# Patient Record
Sex: Male | Born: 1965 | Race: White | Hispanic: No
Health system: Southern US, Community
[De-identification: ages and names within clinical notes are randomized; demographics above are authoritative.]

---

## 2009-10-23 ENCOUNTER — Emergency Department: Payer: Self-pay | Admitting: Emergency Medicine

## 2011-04-11 ENCOUNTER — Emergency Department: Payer: Self-pay | Admitting: Emergency Medicine

## 2011-06-30 ENCOUNTER — Emergency Department: Payer: Self-pay | Admitting: Emergency Medicine

## 2011-07-23 ENCOUNTER — Emergency Department: Payer: Self-pay | Admitting: Emergency Medicine

## 2011-08-04 ENCOUNTER — Inpatient Hospital Stay: Payer: Self-pay | Admitting: Internal Medicine

## 2011-11-14 ENCOUNTER — Emergency Department: Payer: Self-pay | Admitting: Emergency Medicine

## 2011-11-14 LAB — DRUG SCREEN, URINE
Amphetamines, Ur Screen: NEGATIVE (ref ?–1000)
Barbiturates, Ur Screen: NEGATIVE (ref ?–200)
Benzodiazepine, Ur Scrn: NEGATIVE (ref ?–200)
Cannabinoid 50 Ng, Ur ~~LOC~~: NEGATIVE (ref ?–50)
MDMA (Ecstasy)Ur Screen: NEGATIVE (ref ?–500)
Opiate, Ur Screen: NEGATIVE (ref ?–300)
Phencyclidine (PCP) Ur S: NEGATIVE (ref ?–25)

## 2011-11-14 LAB — PROTIME-INR
INR: 0.9
Prothrombin Time: 12.7 secs (ref 11.5–14.7)

## 2011-11-14 LAB — URINALYSIS, COMPLETE
Bacteria: NONE SEEN
Bilirubin,UR: NEGATIVE
Glucose,UR: NEGATIVE mg/dL (ref 0–75)
Leukocyte Esterase: NEGATIVE
Nitrite: NEGATIVE
RBC,UR: <1 /HPF (ref 0–5)
Specific Gravity: 1.005 (ref 1.003–1.030)
Squamous Epithelial: NONE SEEN

## 2011-11-14 LAB — COMPREHENSIVE METABOLIC PANEL
Alkaline Phosphatase: 71 U/L (ref 50–136)
Anion Gap: 9 (ref 7–16)
BUN: 10 mg/dL (ref 7–18)
Bilirubin,Total: 0.7 mg/dL (ref 0.2–1.0)
Creatinine: 1.49 mg/dL — ABNORMAL HIGH (ref 0.60–1.30)
EGFR (African American): >60
EGFR (Non-African Amer.): 54 — ABNORMAL LOW
Glucose: 143 mg/dL — ABNORMAL HIGH (ref 65–99)
Potassium: 3.8 mmol/L (ref 3.5–5.1)
SGPT (ALT): 33 U/L
Sodium: 136 mmol/L (ref 136–145)
Total Protein: 8.5 g/dL — ABNORMAL HIGH (ref 6.4–8.2)

## 2011-11-14 LAB — CBC
HCT: 48 % (ref 40.0–52.0)
HGB: 16.2 g/dL (ref 13.0–18.0)
MCHC: 33.8 g/dL (ref 32.0–36.0)
MCV: 107 fL — ABNORMAL HIGH (ref 80–100)
Platelet: 176 10*3/uL (ref 150–440)
WBC: 17.4 10*3/uL — ABNORMAL HIGH (ref 3.8–10.6)

## 2011-11-14 LAB — ETHANOL: Ethanol: <3 mg/dL

## 2011-12-10 ENCOUNTER — Emergency Department: Payer: Self-pay | Admitting: Emergency Medicine

## 2011-12-10 LAB — COMPREHENSIVE METABOLIC PANEL
Albumin: 4 g/dL (ref 3.4–5.0)
Alkaline Phosphatase: 76 U/L (ref 50–136)
BUN: 4 mg/dL — ABNORMAL LOW (ref 7–18)
Calcium, Total: 8.9 mg/dL (ref 8.5–10.1)
Chloride: 104 mmol/L (ref 98–107)
Co2: 22 mmol/L (ref 21–32)
Creatinine: 0.94 mg/dL (ref 0.60–1.30)
Glucose: 186 mg/dL — ABNORMAL HIGH (ref 65–99)
Potassium: 3.1 mmol/L — ABNORMAL LOW (ref 3.5–5.1)
SGOT(AST): 33 U/L (ref 15–37)
SGPT (ALT): 22 U/L
Sodium: 138 mmol/L (ref 136–145)

## 2011-12-10 LAB — CBC
HCT: 48.1 % (ref 40.0–52.0)
HGB: 16.4 g/dL (ref 13.0–18.0)
MCH: 37.1 pg — ABNORMAL HIGH (ref 26.0–34.0)
MCV: 109 fL — ABNORMAL HIGH (ref 80–100)
Platelet: 245 10*3/uL (ref 150–440)

## 2011-12-10 LAB — LIPASE, BLOOD: Lipase: 4254 U/L — ABNORMAL HIGH (ref 73–393)

## 2011-12-10 LAB — URINALYSIS, COMPLETE
Bilirubin,UR: NEGATIVE
Blood: NEGATIVE
Glucose,UR: 150 mg/dL (ref 0–75)
Ketone: NEGATIVE
Leukocyte Esterase: NEGATIVE
Ph: 6 (ref 4.5–8.0)
Specific Gravity: 1.002 (ref 1.003–1.030)
WBC UR: NONE SEEN /HPF (ref 0–5)

## 2011-12-11 ENCOUNTER — Inpatient Hospital Stay: Payer: Self-pay | Admitting: Internal Medicine

## 2011-12-11 LAB — CBC
HCT: 45.9 % (ref 40.0–52.0)
HGB: 15.8 g/dL (ref 13.0–18.0)
MCH: 36.8 pg — ABNORMAL HIGH (ref 26.0–34.0)
MCV: 107 fL — ABNORMAL HIGH (ref 80–100)
Platelet: 263 10*3/uL (ref 150–440)
RBC: 4.3 10*6/uL — ABNORMAL LOW (ref 4.40–5.90)
WBC: 8.8 10*3/uL (ref 3.8–10.6)

## 2011-12-11 LAB — COMPREHENSIVE METABOLIC PANEL
Albumin: 4 g/dL (ref 3.4–5.0)
Alkaline Phosphatase: 83 U/L (ref 50–136)
Anion Gap: 10 (ref 7–16)
BUN: 4 mg/dL — ABNORMAL LOW (ref 7–18)
Creatinine: 0.79 mg/dL (ref 0.60–1.30)
EGFR (African American): >60
Glucose: 118 mg/dL — ABNORMAL HIGH (ref 65–99)
Osmolality: 277 (ref 275–301)
SGOT(AST): 41 U/L — ABNORMAL HIGH (ref 15–37)
SGPT (ALT): 27 U/L
Total Protein: 8.3 g/dL — ABNORMAL HIGH (ref 6.4–8.2)

## 2011-12-11 LAB — HEMOGLOBIN A1C: Hemoglobin A1C: 5.2 % (ref 4.2–6.3)

## 2011-12-11 LAB — PROTIME-INR: INR: 0.9

## 2011-12-12 LAB — HEPATIC FUNCTION PANEL A (ARMC)
Alkaline Phosphatase: 61 U/L (ref 50–136)
Bilirubin, Direct: <0.1 mg/dL (ref 0.00–0.20)
Bilirubin,Total: 0.6 mg/dL (ref 0.2–1.0)
SGOT(AST): 28 U/L (ref 15–37)
SGPT (ALT): 21 U/L
Total Protein: 6.4 g/dL (ref 6.4–8.2)

## 2011-12-13 ENCOUNTER — Emergency Department: Payer: Self-pay | Admitting: Emergency Medicine

## 2011-12-13 LAB — COMPREHENSIVE METABOLIC PANEL
Alkaline Phosphatase: 70 U/L (ref 50–136)
Anion Gap: 6 — ABNORMAL LOW (ref 7–16)
Creatinine: 0.82 mg/dL (ref 0.60–1.30)
EGFR (African American): >60
EGFR (Non-African Amer.): >60
Glucose: 95 mg/dL (ref 65–99)
Osmolality: 274 (ref 275–301)
Potassium: 3.8 mmol/L (ref 3.5–5.1)
SGPT (ALT): 24 U/L
Total Protein: 7.6 g/dL (ref 6.4–8.2)

## 2011-12-13 LAB — CBC
HCT: 39.3 % — ABNORMAL LOW (ref 40.0–52.0)
MCH: 37.9 pg — ABNORMAL HIGH (ref 26.0–34.0)
MCHC: 35.1 g/dL (ref 32.0–36.0)
Platelet: 193 10*3/uL (ref 150–440)
RDW: 12.7 % (ref 11.5–14.5)
WBC: 8.9 10*3/uL (ref 3.8–10.6)

## 2011-12-13 LAB — URINALYSIS, COMPLETE
Bacteria: NONE SEEN
Blood: NEGATIVE
Ketone: NEGATIVE
RBC,UR: NONE SEEN /HPF (ref 0–5)
Squamous Epithelial: <1
WBC UR: NONE SEEN /HPF (ref 0–5)

## 2012-02-17 ENCOUNTER — Inpatient Hospital Stay: Payer: Self-pay | Admitting: Specialist

## 2012-02-17 LAB — CBC
HCT: 41.9 % (ref 40.0–52.0)
HGB: 13.6 g/dL (ref 13.0–18.0)
MCH: 35 pg — ABNORMAL HIGH (ref 26.0–34.0)
MCHC: 32.4 g/dL (ref 32.0–36.0)
MCV: 108 fL — ABNORMAL HIGH (ref 80–100)
RBC: 3.87 10*6/uL — ABNORMAL LOW (ref 4.40–5.90)
RDW: 12.7 % (ref 11.5–14.5)

## 2012-02-17 LAB — URINALYSIS, COMPLETE
Bacteria: NONE SEEN
Bilirubin,UR: NEGATIVE
Glucose,UR: NEGATIVE mg/dL (ref 0–75)
Ketone: NEGATIVE
Leukocyte Esterase: NEGATIVE
Ph: 5 (ref 4.5–8.0)
Protein: NEGATIVE
RBC,UR: <1 /HPF (ref 0–5)
Squamous Epithelial: <1
WBC UR: 1 /HPF (ref 0–5)

## 2012-02-17 LAB — COMPREHENSIVE METABOLIC PANEL
Albumin: 3.4 g/dL (ref 3.4–5.0)
Alkaline Phosphatase: 81 U/L (ref 50–136)
Anion Gap: 7 (ref 7–16)
BUN: 8 mg/dL (ref 7–18)
Calcium, Total: 9.1 mg/dL (ref 8.5–10.1)
Creatinine: 0.91 mg/dL (ref 0.60–1.30)
Glucose: 103 mg/dL — ABNORMAL HIGH (ref 65–99)
SGOT(AST): 23 U/L (ref 15–37)
SGPT (ALT): 16 U/L
Total Protein: 7.3 g/dL (ref 6.4–8.2)

## 2012-02-17 LAB — LIPASE, BLOOD: Lipase: 3042 U/L — ABNORMAL HIGH (ref 73–393)

## 2012-02-18 LAB — COMPREHENSIVE METABOLIC PANEL WITH GFR
Albumin: 2.7 g/dL — ABNORMAL LOW
Alkaline Phosphatase: 68 U/L
Anion Gap: 9
BUN: 5 mg/dL — ABNORMAL LOW
Bilirubin,Total: 0.3 mg/dL
Calcium, Total: 8.2 mg/dL — ABNORMAL LOW
Chloride: 105 mmol/L
Co2: 26 mmol/L
Creatinine: 0.77 mg/dL
EGFR (African American): >60
EGFR (Non-African Amer.): >60
Glucose: 118 mg/dL — ABNORMAL HIGH
Osmolality: 278
Potassium: 3.7 mmol/L
SGOT(AST): 13 U/L — ABNORMAL LOW
SGPT (ALT): 12 U/L
Sodium: 140 mmol/L
Total Protein: 6.4 g/dL

## 2012-02-18 LAB — CBC WITH DIFFERENTIAL/PLATELET
Basophil #: 0 10*3/uL (ref 0.0–0.1)
Basophil %: 0.5 %
Eosinophil #: 0.5 10*3/uL (ref 0.0–0.7)
Eosinophil %: 6.9 %
HCT: 34.6 % — ABNORMAL LOW (ref 40.0–52.0)
Lymphocyte #: 1.6 10*3/uL (ref 1.0–3.6)
Lymphocyte %: 21.1 %
MCH: 36.2 pg — ABNORMAL HIGH (ref 26.0–34.0)
MCHC: 33.5 g/dL (ref 32.0–36.0)
MCV: 108 fL — ABNORMAL HIGH (ref 80–100)
Monocyte %: 8.7 %
Neutrophil %: 62.8 %
Platelet: 292 10*3/uL (ref 150–440)
RBC: 3.2 10*6/uL — ABNORMAL LOW (ref 4.40–5.90)
RDW: 12.9 % (ref 11.5–14.5)

## 2012-02-18 LAB — MAGNESIUM: Magnesium: 2.3 mg/dL

## 2012-02-19 LAB — BASIC METABOLIC PANEL
Calcium, Total: 8.5 mg/dL (ref 8.5–10.1)
Chloride: 108 mmol/L — ABNORMAL HIGH (ref 98–107)
Co2: 28 mmol/L (ref 21–32)
EGFR (Non-African Amer.): >60
Potassium: 3.7 mmol/L (ref 3.5–5.1)

## 2012-02-19 LAB — LIPASE, BLOOD: Lipase: 1321 U/L — ABNORMAL HIGH (ref 73–393)

## 2012-02-20 LAB — LIPASE, BLOOD: Lipase: 856 U/L — ABNORMAL HIGH (ref 73–393)

## 2012-02-21 LAB — COMPREHENSIVE METABOLIC PANEL
Anion Gap: 7 (ref 7–16)
Bilirubin,Total: 0.2 mg/dL (ref 0.2–1.0)
Chloride: 107 mmol/L (ref 98–107)
Co2: 29 mmol/L (ref 21–32)
Creatinine: 0.73 mg/dL (ref 0.60–1.30)
EGFR (African American): >60
EGFR (Non-African Amer.): >60
SGOT(AST): 12 U/L — ABNORMAL LOW (ref 15–37)
Sodium: 143 mmol/L (ref 136–145)
Total Protein: 6 g/dL — ABNORMAL LOW (ref 6.4–8.2)

## 2012-02-21 LAB — CBC WITH DIFFERENTIAL/PLATELET
Basophil #: 0.1 10*3/uL (ref 0.0–0.1)
Basophil %: 0.5 %
Eosinophil #: 0.7 10*3/uL (ref 0.0–0.7)
Eosinophil %: 6.6 %
HGB: 10.7 g/dL — ABNORMAL LOW (ref 13.0–18.0)
Lymphocyte %: 22.5 %
MCH: 36.2 pg — ABNORMAL HIGH (ref 26.0–34.0)
MCHC: 33.3 g/dL (ref 32.0–36.0)
Monocyte #: 0.8 x10 3/mm (ref 0.2–1.0)
Monocyte %: 8.2 %
Neutrophil #: 6.3 10*3/uL (ref 1.4–6.5)
Neutrophil %: 62.2 %
RDW: 12.9 % (ref 11.5–14.5)
WBC: 10 10*3/uL (ref 3.8–10.6)

## 2012-02-21 LAB — LIPASE, BLOOD: Lipase: >3000 U/L (ref 73–393)

## 2012-02-22 LAB — LIPASE, BLOOD: Lipase: 1369 U/L — ABNORMAL HIGH (ref 73–393)

## 2012-03-06 LAB — URINALYSIS, COMPLETE
Bacteria: NONE SEEN
Blood: NEGATIVE
Glucose,UR: NEGATIVE mg/dL (ref 0–75)
Leukocyte Esterase: NEGATIVE
Nitrite: NEGATIVE
Protein: NEGATIVE
RBC,UR: 2 /HPF (ref 0–5)
Specific Gravity: 1.019 (ref 1.003–1.030)
WBC UR: 1 /HPF (ref 0–5)

## 2012-03-06 LAB — CBC
HCT: 36.9 % — ABNORMAL LOW (ref 40.0–52.0)
HGB: 12.5 g/dL — ABNORMAL LOW (ref 13.0–18.0)
MCHC: 33.7 g/dL (ref 32.0–36.0)
Platelet: 418 10*3/uL (ref 150–440)
RBC: 3.51 10*6/uL — ABNORMAL LOW (ref 4.40–5.90)
WBC: 9.9 10*3/uL (ref 3.8–10.6)

## 2012-03-07 ENCOUNTER — Inpatient Hospital Stay: Payer: Self-pay | Admitting: Internal Medicine

## 2012-03-07 LAB — COMPREHENSIVE METABOLIC PANEL
Albumin: 3.5 g/dL (ref 3.4–5.0)
Anion Gap: 9 (ref 7–16)
Calcium, Total: 9.3 mg/dL (ref 8.5–10.1)
Co2: 27 mmol/L (ref 21–32)
EGFR (Non-African Amer.): >60
Glucose: 105 mg/dL — ABNORMAL HIGH (ref 65–99)
Osmolality: 280 (ref 275–301)
Potassium: 3.9 mmol/L (ref 3.5–5.1)
SGOT(AST): 18 U/L (ref 15–37)
SGPT (ALT): 14 U/L
Sodium: 141 mmol/L (ref 136–145)
Total Protein: 7.9 g/dL (ref 6.4–8.2)

## 2012-03-07 LAB — CBC WITH DIFFERENTIAL/PLATELET
Eosinophil #: 0.5 10*3/uL (ref 0.0–0.7)
Eosinophil %: 5.3 %
HCT: 36.3 % — ABNORMAL LOW (ref 40.0–52.0)
Lymphocyte #: 3 10*3/uL (ref 1.0–3.6)
MCH: 36 pg — ABNORMAL HIGH (ref 26.0–34.0)
MCV: 104 fL — ABNORMAL HIGH (ref 80–100)
Monocyte #: 0.8 x10 3/mm (ref 0.2–1.0)
Monocyte %: 8.9 %
Neutrophil #: 4.4 10*3/uL (ref 1.4–6.5)
Platelet: 386 10*3/uL (ref 150–440)
RBC: 3.48 10*6/uL — ABNORMAL LOW (ref 4.40–5.90)
WBC: 8.7 10*3/uL (ref 3.8–10.6)

## 2012-03-07 LAB — LIPASE, BLOOD
Lipase: >3000 U/L (ref 73–393)
Lipase: 3772 U/L — ABNORMAL HIGH (ref 73–393)

## 2012-03-08 LAB — CBC WITH DIFFERENTIAL/PLATELET
Basophil #: 0 10*3/uL (ref 0.0–0.1)
Basophil %: 0.5 %
Eosinophil #: 0.3 10*3/uL (ref 0.0–0.7)
HGB: 11.3 g/dL — ABNORMAL LOW (ref 13.0–18.0)
Lymphocyte #: 2.3 10*3/uL (ref 1.0–3.6)
Lymphocyte %: 31.3 %
MCH: 36 pg — ABNORMAL HIGH (ref 26.0–34.0)
MCHC: 34.3 g/dL (ref 32.0–36.0)
MCV: 105 fL — ABNORMAL HIGH (ref 80–100)
Monocyte #: 0.9 x10 3/mm (ref 0.2–1.0)
Neutrophil #: 3.9 10*3/uL (ref 1.4–6.5)
Platelet: 307 10*3/uL (ref 150–440)
RDW: 13.2 % (ref 11.5–14.5)

## 2012-03-08 LAB — BASIC METABOLIC PANEL
Anion Gap: 7 (ref 7–16)
Calcium, Total: 8.4 mg/dL — ABNORMAL LOW (ref 8.5–10.1)
Chloride: 111 mmol/L — ABNORMAL HIGH (ref 98–107)
Co2: 26 mmol/L (ref 21–32)
Glucose: 77 mg/dL (ref 65–99)
Osmolality: 282 (ref 275–301)
Potassium: 3.6 mmol/L (ref 3.5–5.1)
Sodium: 144 mmol/L (ref 136–145)

## 2012-03-08 LAB — LIPID PANEL
Cholesterol: 120 mg/dL (ref 0–200)
HDL Cholesterol: 23 mg/dL — ABNORMAL LOW (ref 40–60)
Ldl Cholesterol, Calc: 79 mg/dL (ref 0–100)
Triglycerides: 91 mg/dL (ref 0–200)
VLDL Cholesterol, Calc: 18 mg/dL (ref 5–40)

## 2012-03-16 ENCOUNTER — Emergency Department: Payer: Self-pay | Admitting: Emergency Medicine

## 2012-03-16 LAB — COMPREHENSIVE METABOLIC PANEL
Albumin: 4 g/dL (ref 3.4–5.0)
Alkaline Phosphatase: 91 U/L (ref 50–136)
Bilirubin,Total: 0.2 mg/dL (ref 0.2–1.0)
Chloride: 105 mmol/L (ref 98–107)
Co2: 28 mmol/L (ref 21–32)
Creatinine: 0.83 mg/dL (ref 0.60–1.30)
EGFR (African American): >60
EGFR (Non-African Amer.): >60
Osmolality: 280 (ref 275–301)
SGOT(AST): 26 U/L (ref 15–37)
Total Protein: 8.7 g/dL — ABNORMAL HIGH (ref 6.4–8.2)

## 2012-03-16 LAB — LIPASE, BLOOD: Lipase: >3000 U/L (ref 73–393)

## 2012-03-16 LAB — CBC
HCT: 44.4 % (ref 40.0–52.0)
MCHC: 33.6 g/dL (ref 32.0–36.0)
MCV: 103 fL — ABNORMAL HIGH (ref 80–100)
Platelet: 301 10*3/uL (ref 150–440)
RDW: 13.9 % (ref 11.5–14.5)

## 2012-03-16 LAB — ETHANOL: Ethanol: 225 mg/dL

## 2012-08-20 LAB — COMPREHENSIVE METABOLIC PANEL
Alkaline Phosphatase: 75 U/L (ref 50–136)
Anion Gap: 6 — ABNORMAL LOW (ref 7–16)
BUN: 8 mg/dL (ref 7–18)
Chloride: 106 mmol/L (ref 98–107)
Co2: 28 mmol/L (ref 21–32)
Creatinine: 0.73 mg/dL (ref 0.60–1.30)
EGFR (African American): >60
Glucose: 91 mg/dL (ref 65–99)
SGOT(AST): 18 U/L (ref 15–37)
SGPT (ALT): 15 U/L (ref 12–78)
Sodium: 140 mmol/L (ref 136–145)
Total Protein: 7.4 g/dL (ref 6.4–8.2)

## 2012-08-20 LAB — CBC
HCT: 38.3 % — ABNORMAL LOW (ref 40.0–52.0)
HGB: 13.5 g/dL (ref 13.0–18.0)
MCHC: 35.2 g/dL (ref 32.0–36.0)
MCV: 102 fL — ABNORMAL HIGH (ref 80–100)
RDW: 12.9 % (ref 11.5–14.5)
WBC: 12.4 10*3/uL — ABNORMAL HIGH (ref 3.8–10.6)

## 2012-08-20 LAB — TROPONIN I: Troponin-I: <0.02 ng/mL

## 2012-08-20 LAB — LIPASE, BLOOD: Lipase: >3000 U/L (ref 73–393)

## 2012-08-21 LAB — HEPATIC FUNCTION PANEL A (ARMC)
Alkaline Phosphatase: 70 U/L (ref 50–136)
Bilirubin, Direct: 0.1 mg/dL (ref 0.00–0.20)
Bilirubin,Total: 0.4 mg/dL (ref 0.2–1.0)
SGOT(AST): 13 U/L — ABNORMAL LOW (ref 15–37)
SGPT (ALT): 14 U/L (ref 12–78)
Total Protein: 6.8 g/dL (ref 6.4–8.2)

## 2012-08-21 LAB — CBC WITH DIFFERENTIAL/PLATELET
Basophil #: 0.1 10*3/uL (ref 0.0–0.1)
Basophil %: 0.5 %
Eosinophil %: 4.9 %
HGB: 12.4 g/dL — ABNORMAL LOW (ref 13.0–18.0)
Lymphocyte #: 2.3 10*3/uL (ref 1.0–3.6)
MCHC: 33.3 g/dL (ref 32.0–36.0)
MCV: 103 fL — ABNORMAL HIGH (ref 80–100)
Monocyte #: 0.9 x10 3/mm (ref 0.2–1.0)
Neutrophil #: 6.5 10*3/uL (ref 1.4–6.5)
Platelet: 166 10*3/uL (ref 150–440)
RDW: 12.9 % (ref 11.5–14.5)
WBC: 10.3 10*3/uL (ref 3.8–10.6)

## 2012-08-21 LAB — LIPID PANEL
Cholesterol: 189 mg/dL (ref 0–200)
HDL Cholesterol: 38 mg/dL — ABNORMAL LOW (ref 40–60)
Ldl Cholesterol, Calc: 105 mg/dL — ABNORMAL HIGH (ref 0–100)
Triglycerides: 232 mg/dL — ABNORMAL HIGH (ref 0–200)

## 2012-08-21 LAB — LIPASE, BLOOD: Lipase: >3000 U/L (ref 73–393)

## 2012-08-22 ENCOUNTER — Inpatient Hospital Stay: Payer: Self-pay | Admitting: Internal Medicine

## 2012-08-22 LAB — BASIC METABOLIC PANEL
Chloride: 110 mmol/L — ABNORMAL HIGH (ref 98–107)
Creatinine: 0.61 mg/dL (ref 0.60–1.30)
EGFR (Non-African Amer.): >60
Glucose: 93 mg/dL (ref 65–99)
Potassium: 3.8 mmol/L (ref 3.5–5.1)
Sodium: 143 mmol/L (ref 136–145)

## 2012-08-24 LAB — HEMOGLOBIN: HGB: 10.8 g/dL — ABNORMAL LOW (ref 13.0–18.0)

## 2013-05-21 IMAGING — CT CT ABDOMEN W/ CM
1 of 2 series · 15 of 32 positions shown, 19 images · IV contrast (isovue)
Comparison: none

REASON FOR EXAM: severe pancreatitis with elevated lipase
COMMENTS:

PROCEDURE:     CT  - CT ABDOMEN STANDARD W  - February 21, 2012 [DATE]
RESULT:     Comparison: CT of the abdomen and pelvis 02/17/2012 and 11/14/2011
TECHNIQUE: Multiple axial images of the abdomen were performed from the lung
bases to the iliac crests, with p.o. contrast and with 85 mL of Isovue 370
intravenous contrast.

[Series 2: 3mm soft tissue · axial · 0.67mm/px · z∈[-332,-56]mm · 15 of 102 slices shown, 19 images]
[im 5/102  soft-tissue]
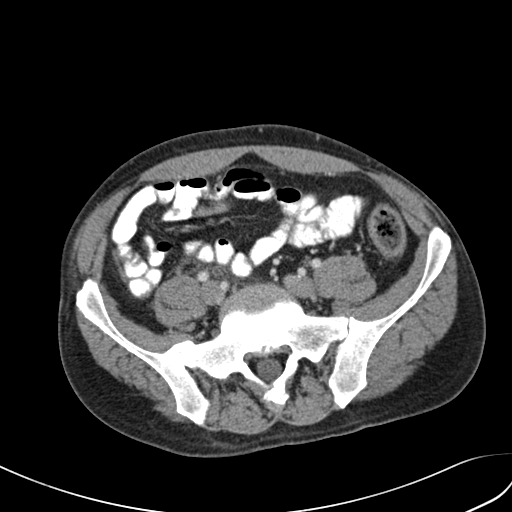
[im 5/102  bone]
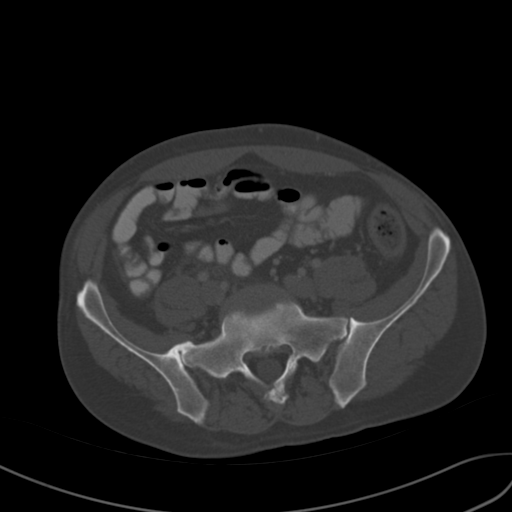
[im 14/102  soft-tissue]
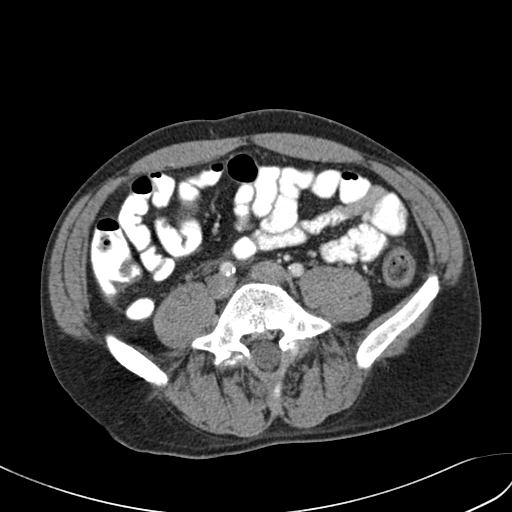
[im 22/102  soft-tissue]
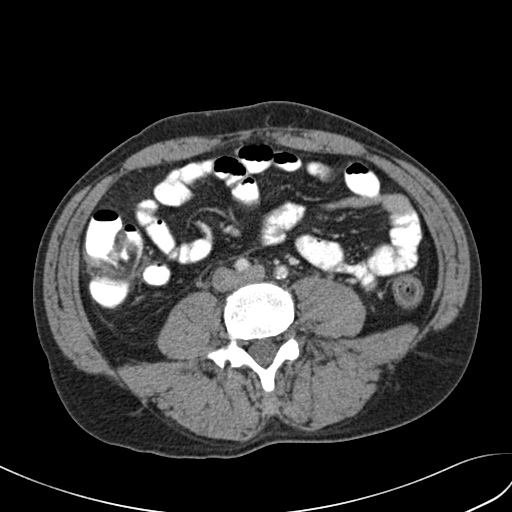
[im 27/102  soft-tissue]
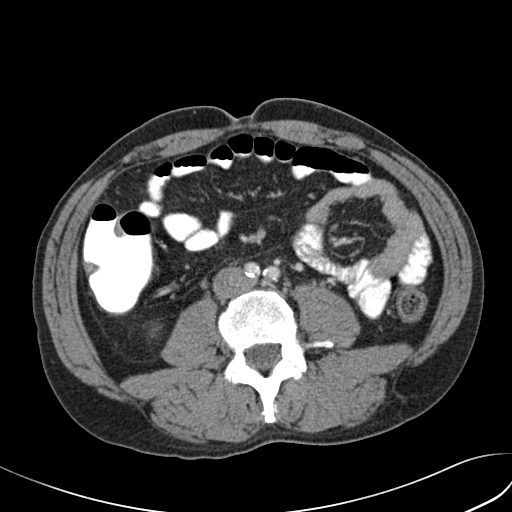
[im 36/102  soft-tissue]
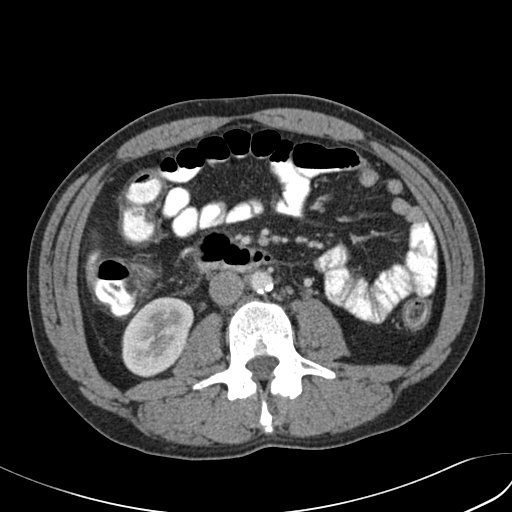
[im 44/102  soft-tissue]
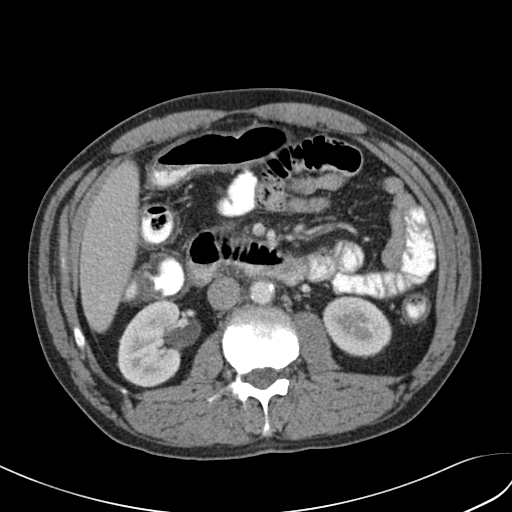
[im 53/102  soft-tissue]
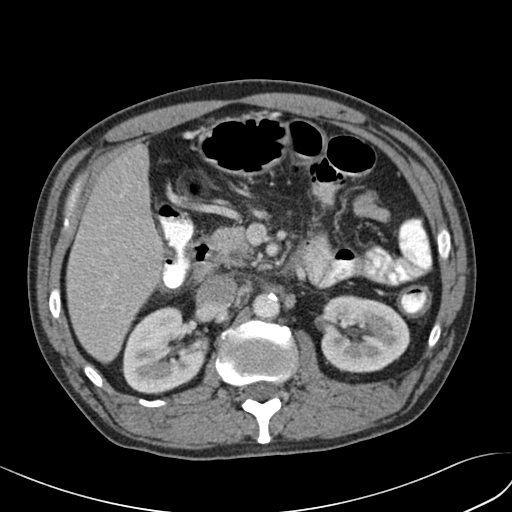
[im 58/102  soft-tissue]
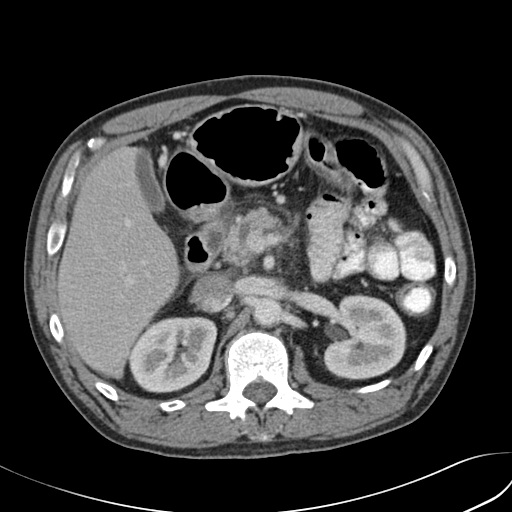
[im 66/102  soft-tissue]
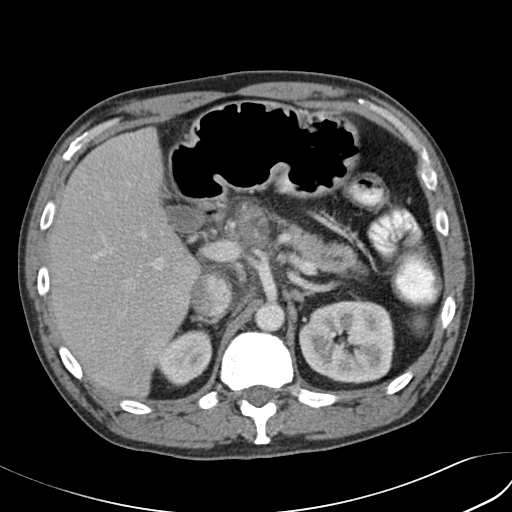
[im 66/102  bone]
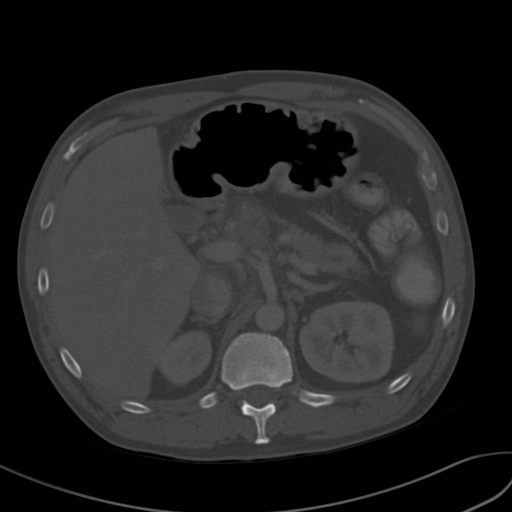
[im 75/102  soft-tissue]
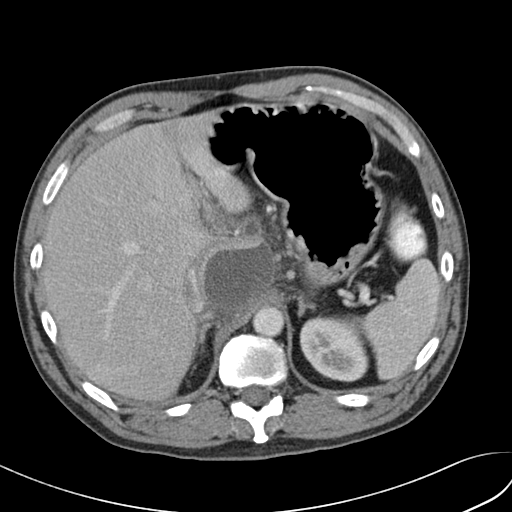
[im 80/102  soft-tissue]
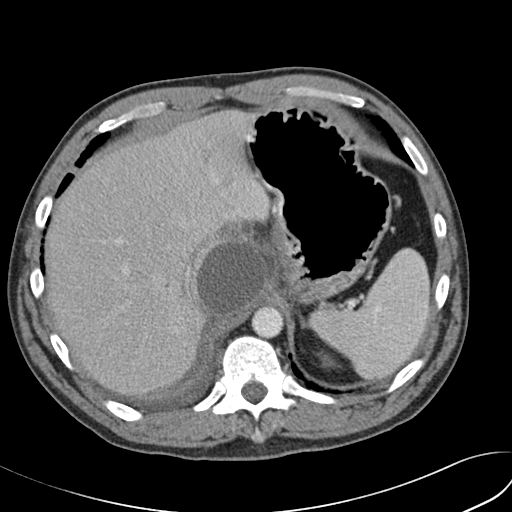
[im 84/102  lung]
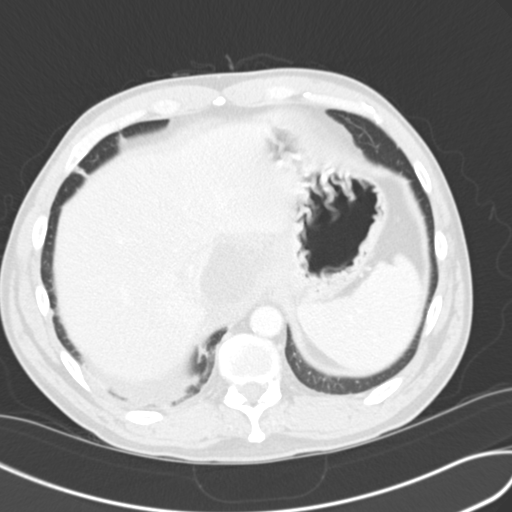
[im 88/102  soft-tissue]
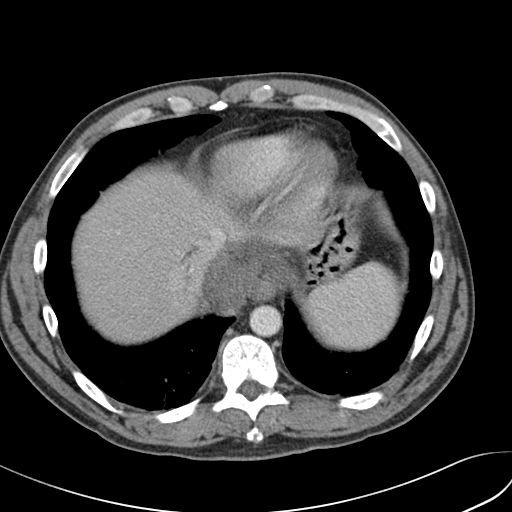
[im 88/102  lung]
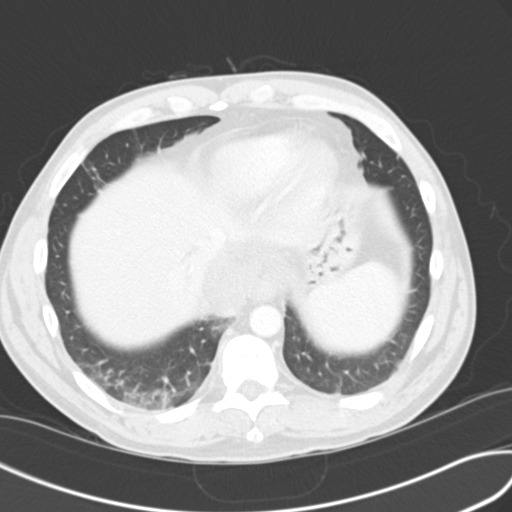
[im 93/102  lung]
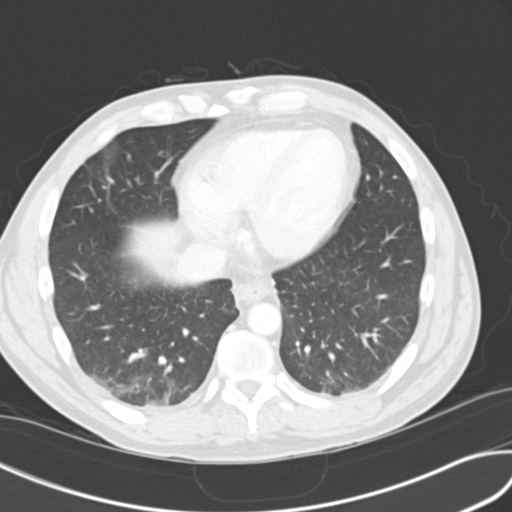
[im 97/102  soft-tissue]
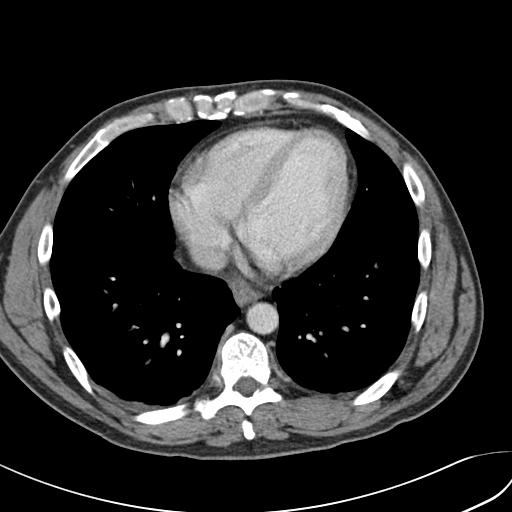
[im 97/102  lung]
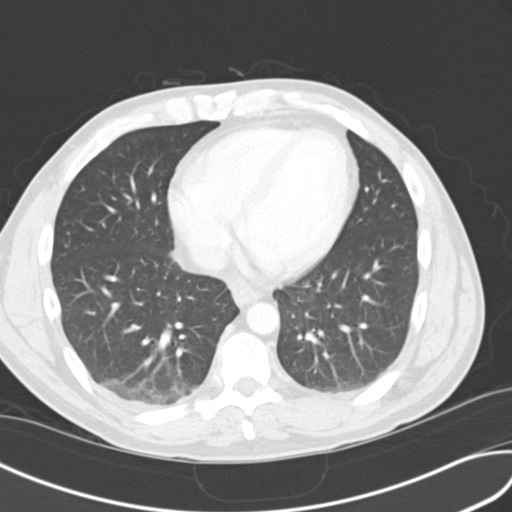

[15 of 32 positions shown; findings below may reference images not displayed]

FINDINGS: Mild basilar opacities are likely secondary to atelectasis. Minimal
low-attenuation along the falciform ligament likely represents focal fatty
deposition. The gallbladder, spleen, adrenals a are unremarkable. There is
mild inflammatory stranding surrounding the body and head of the pancreas
which is slightly decreased from prior. However, there are multiple small
fluid collections and low-attenuation lesions in the region the pancreatic
head and extending superiorly which likely represent small pseudocysts. Many
of these have increased in size from prior while others are new. The largest
is seen insinuating superiorly adjacent to the intrahepatic inferior vena
cava. It measures 4.6 x 3.9 cm. This causes some mass effect and flattening
of the intrahepatic inferior vena cava.

The kidneys enhance normally. The visualized small and large bowel are
normal in caliber.

No aggressive lytic or sclerotic osseous lesions are identified.
IMPRESSION: The inflammatory changes surrounding the pancreatic head and body have
decreased from prior. However, there are new and increased size of the small
fluid collections and low-attenuation lesions in the region of the
pancreatic head which extend superiorly. The largest is seen insinuating
superiorly adjacent to the intrahepatic IVC, causing mass effect on the IVC.
These most likely represent pseudocysts given the adjacent pancreatitis.
However, followup CT of the abdomen is recommended to ensure resolution and
exclude one or more of these representing a cystic neoplasm, though this is
felt unlikely.

## 2013-09-15 ENCOUNTER — Emergency Department: Payer: Self-pay | Admitting: Emergency Medicine

## 2013-09-19 ENCOUNTER — Emergency Department: Payer: Self-pay | Admitting: Emergency Medicine

## 2013-11-21 IMAGING — CT CT ABD-PELV W/ CM
1 of 2 series · 14 of 32 positions shown, 18 images · IV contrast (isovue)
Comparison: none

REASON FOR EXAM: (1) pancreatitis, h/o pancreatic mass, also pain in
lower abdomen; (2) same, h/o
COMMENTS:

PROCEDURE:     CT  - CT ABDOMEN / PELVIS  W  - August 23, 2012  [DATE]
RESULT:     Comparison:  02/17/2012, 02/21/2012
TECHNIQUE: Multiple axial images of the abdomen and pelvis were performed
from the lung bases to the pubic symphysis, with p.o. contrast and with 100
mL of Isovue 300 intravenous contrast.

[Series 2: 3mm soft tissue · axial · 0.67mm/px · z∈[-1132,-721]mm · 14 of 157 slices shown, 18 images]
[im 13/157  soft-tissue]
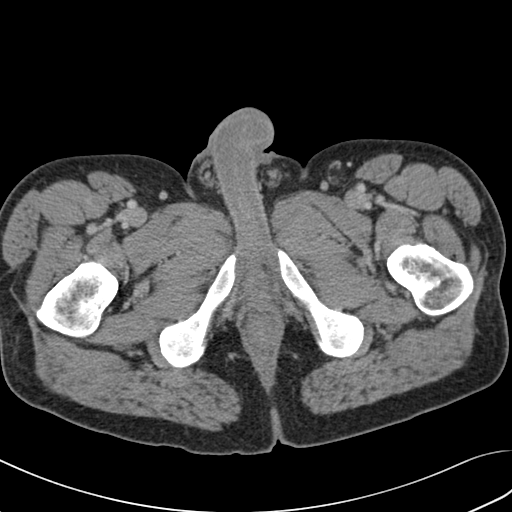
[im 13/157  bone]
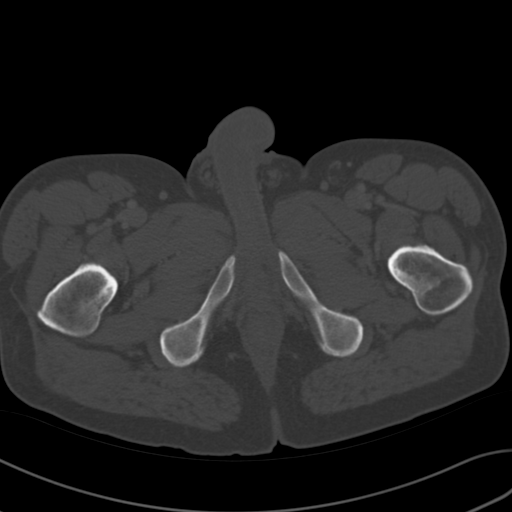
[im 25/157  soft-tissue]
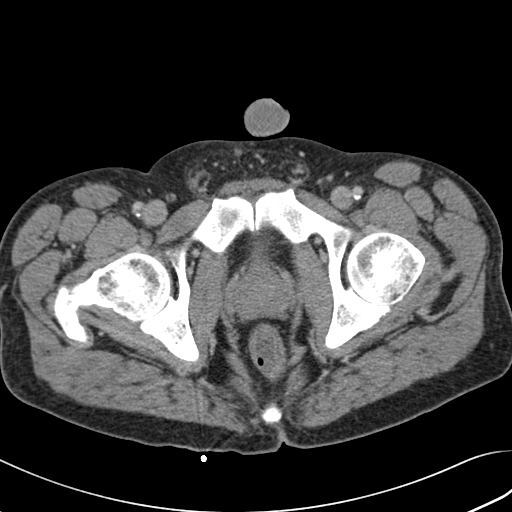
[im 38/157  soft-tissue]
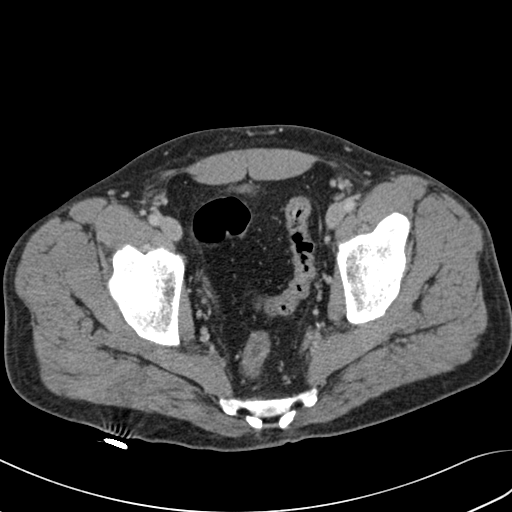
[im 50/157  soft-tissue]
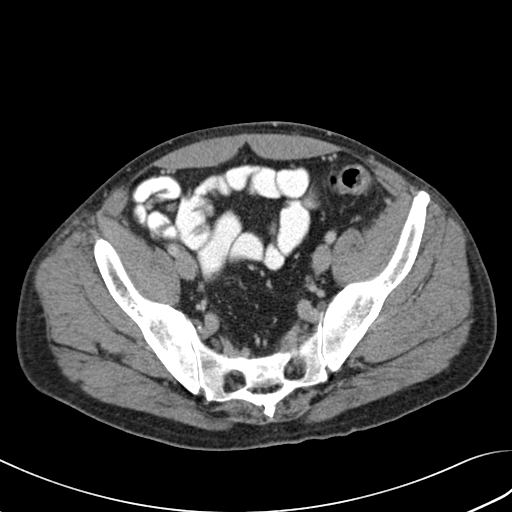
[im 63/157  soft-tissue]
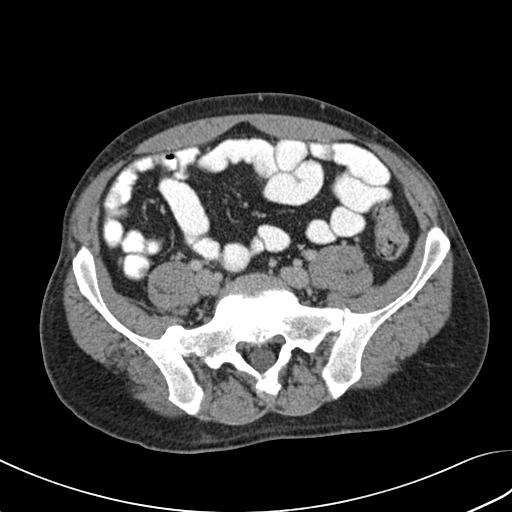
[im 75/157  soft-tissue]
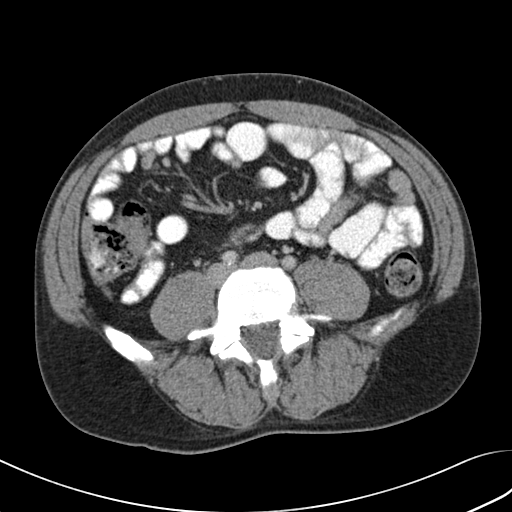
[im 88/157  soft-tissue]
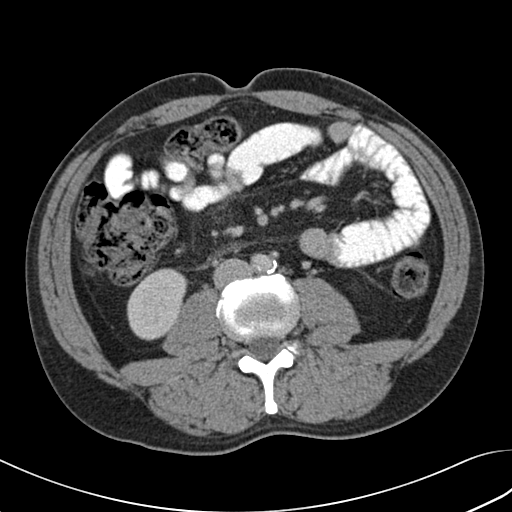
[im 100/157  soft-tissue]
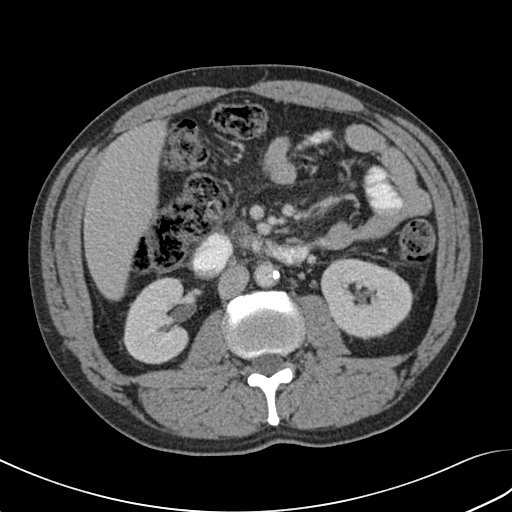
[im 113/157  soft-tissue]
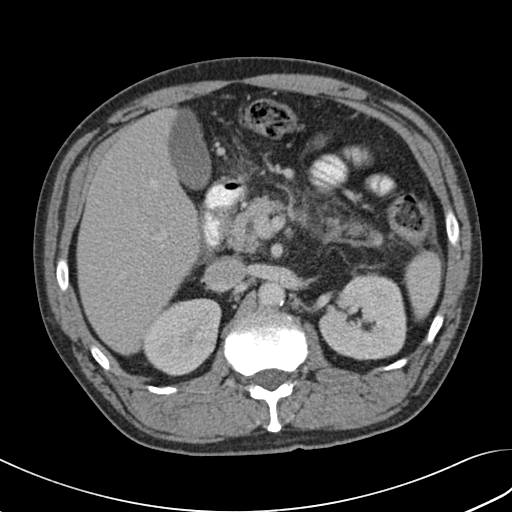
[im 113/157  bone]
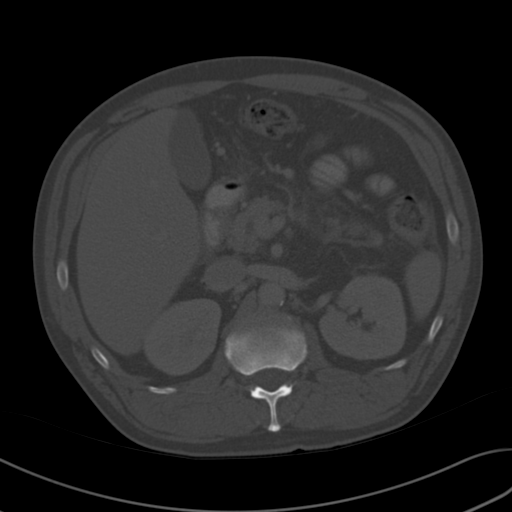
[im 125/157  soft-tissue]
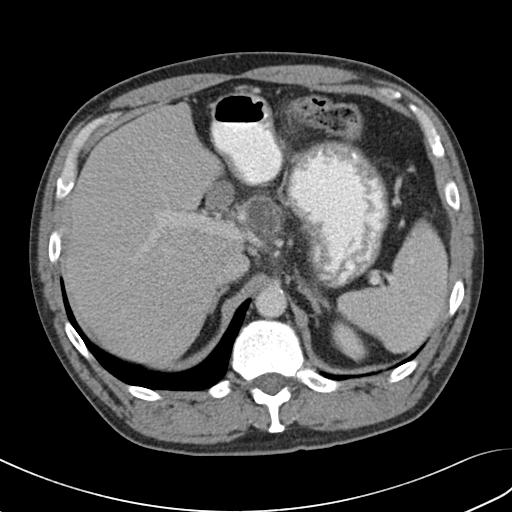
[im 132/157  lung]
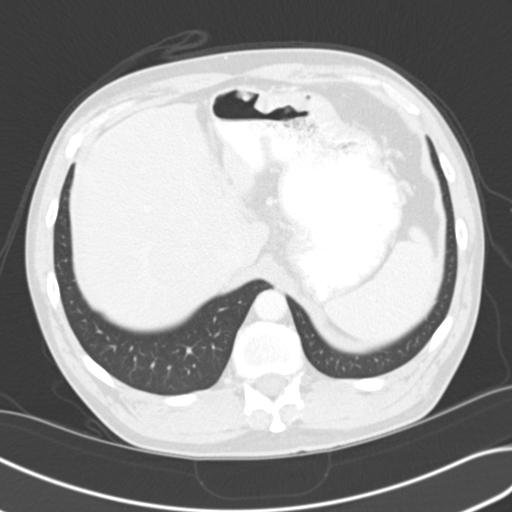
[im 138/157  soft-tissue]
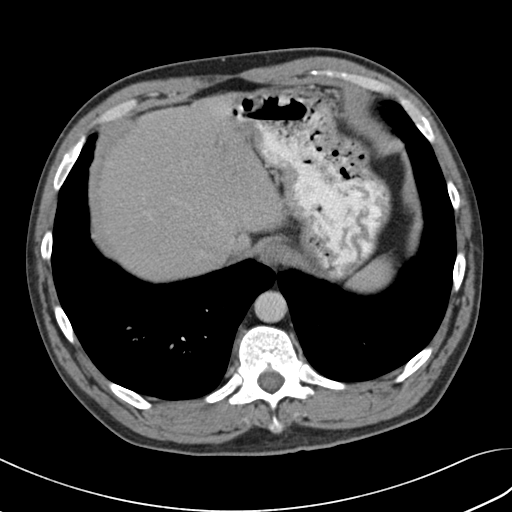
[im 138/157  lung]
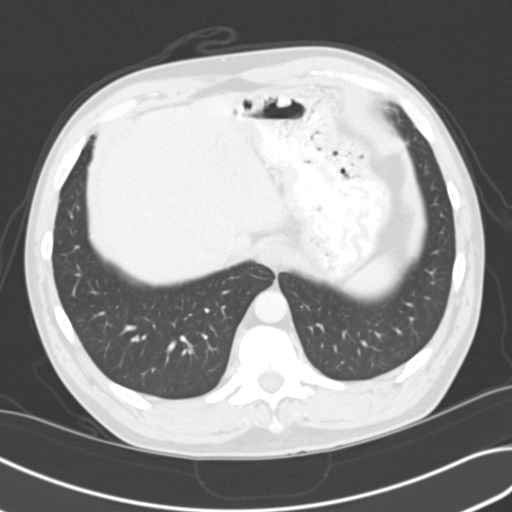
[im 144/157  lung]
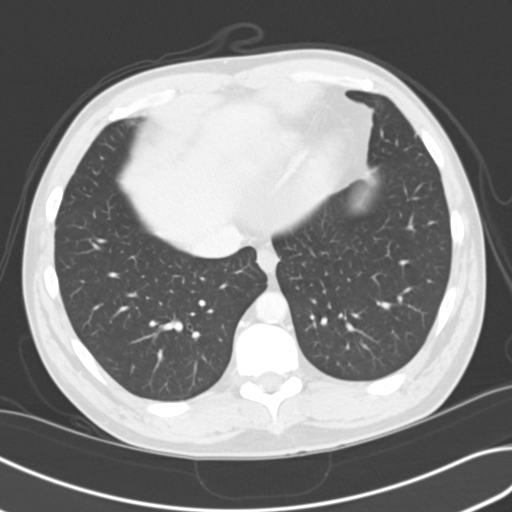
[im 150/157  soft-tissue]
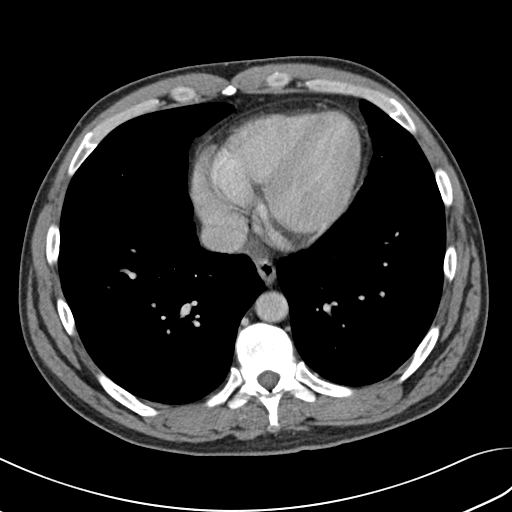
[im 150/157  lung]
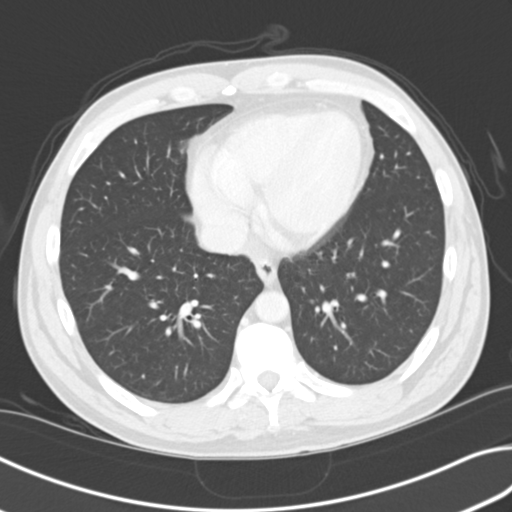

[14 of 32 positions shown; findings below may reference images not displayed]

FINDINGS: There are 3 subcentimeter pulmonary nodules which are immediately adjacent
to each other in the right lower lobe. The largest measures 8 mm. These have
decreased in size from 07/23/2011.

The liver, gallbladder, spleen, adrenals are unremarkable. There is a cystic
mass just superior to the head of the pancreas which is similar to prior. It
measures 2.8 x 2.0 cm in greatest axial dimension. This could represent a
cystic pancreatic neoplasm versus pseudocyst. As it appears to communicate
with the pancreatic duct, this raises the possibility of a cystic neoplasm
such as a sidebranch intraductal papillary mucinous neoplasm. The
inflammatory changes and cystic collection extending superiorly from this
region on the prior study has significantly decreased. There are mild
infiltrative changes surrounding the pancreas on today's study. The main
pancreatic duct is mildly dilated, similar to prior.

The kidneys enhance normally. The small and large bowel are normal in
caliber. Mild prominence of the sigmoid and rectal wall is likely secondary
to underdistention. The appendix is normal. Mild prominence of the bladder
wall is likely secondary to underdistention.

Small round sclerotic density in the right iliac wing is similar to prior.
IMPRESSION: 1. There are mild inflammatory changes surrounding the pancreas. Correlate
for acute pancreatitis. Overall, these changes are decreased from prior.
2. There is a small cystic mass just superior to the pancreas which is
similar in size to prior. Differential would include a pseudocyst as well a
cystic pancreatic neoplasm. It appears to communicate with the main
pancreatic duct, which would raise the possibility of a sidebranch
intraductal papillary mucinous neoplasm. ERCP and EUS is suggested, if not
already performed.
3. Mild prominence of the sigmoid and rectal wall is likely secondary to
underdistention. However, correlate for colitis.

## 2013-11-22 IMAGING — CR DG SHOULDER 3+V*L*
1 series · 3 of 3 positions shown · non-contrast
Comparison: none

REASON FOR EXAM: fall, pain
COMMENTS:

PROCEDURE:     DXR - DXR SHOULDER LEFT COMPLETE  - August 24, 2012  [DATE]
RESULT:     Comparison: None.

[Series 1: w shoulder internal left · 0.14mm/px · 3 of 3 slices shown]
[im 1/3]
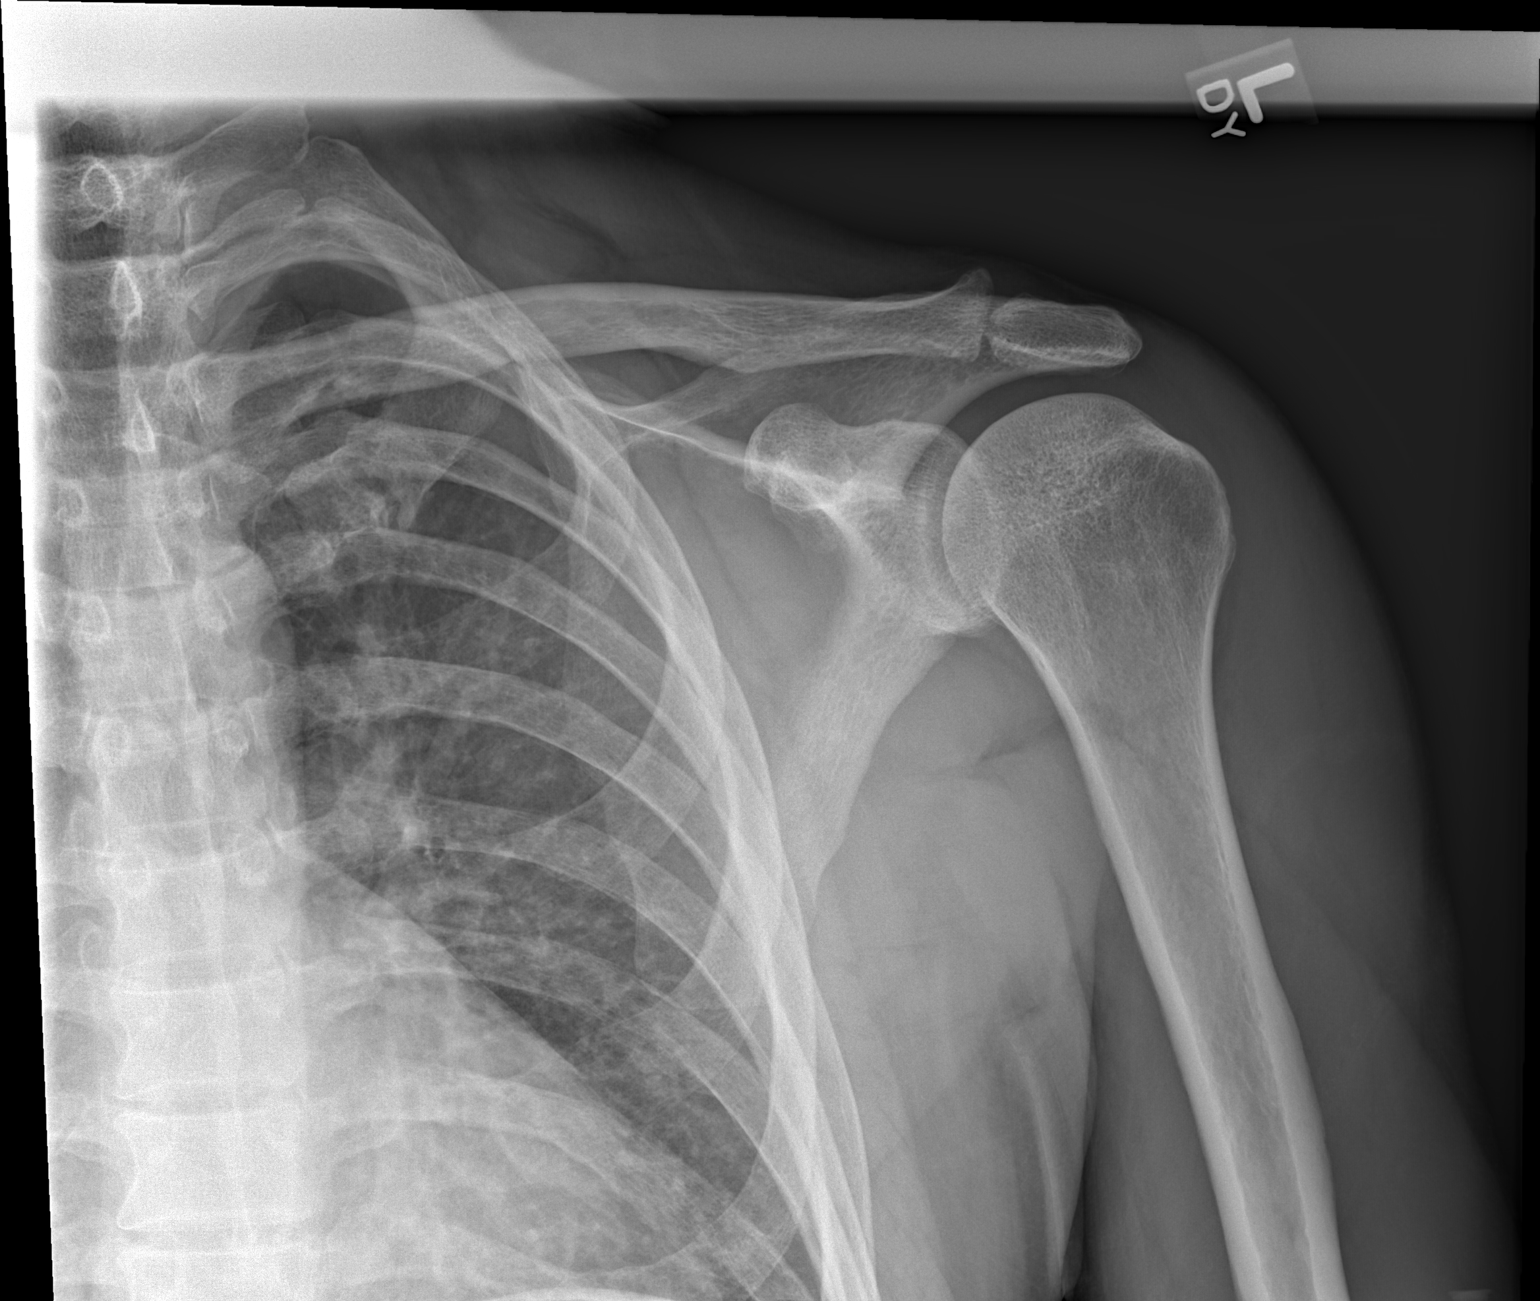
[im 2/3]
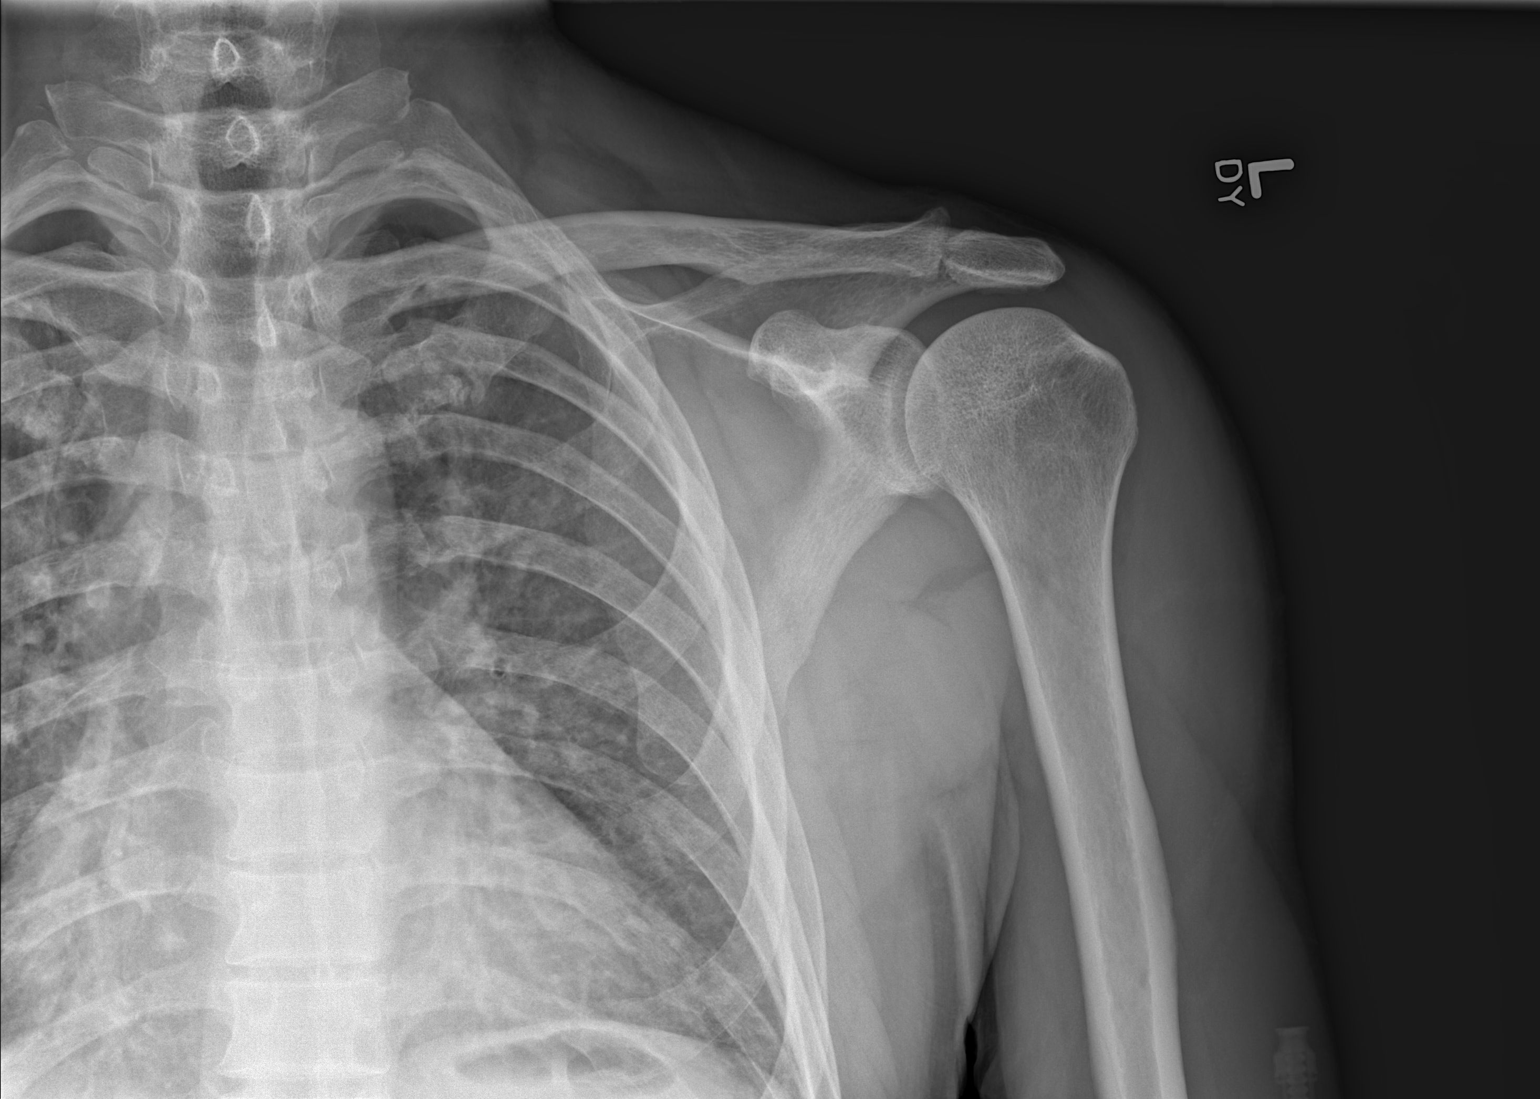
[im 3/3]
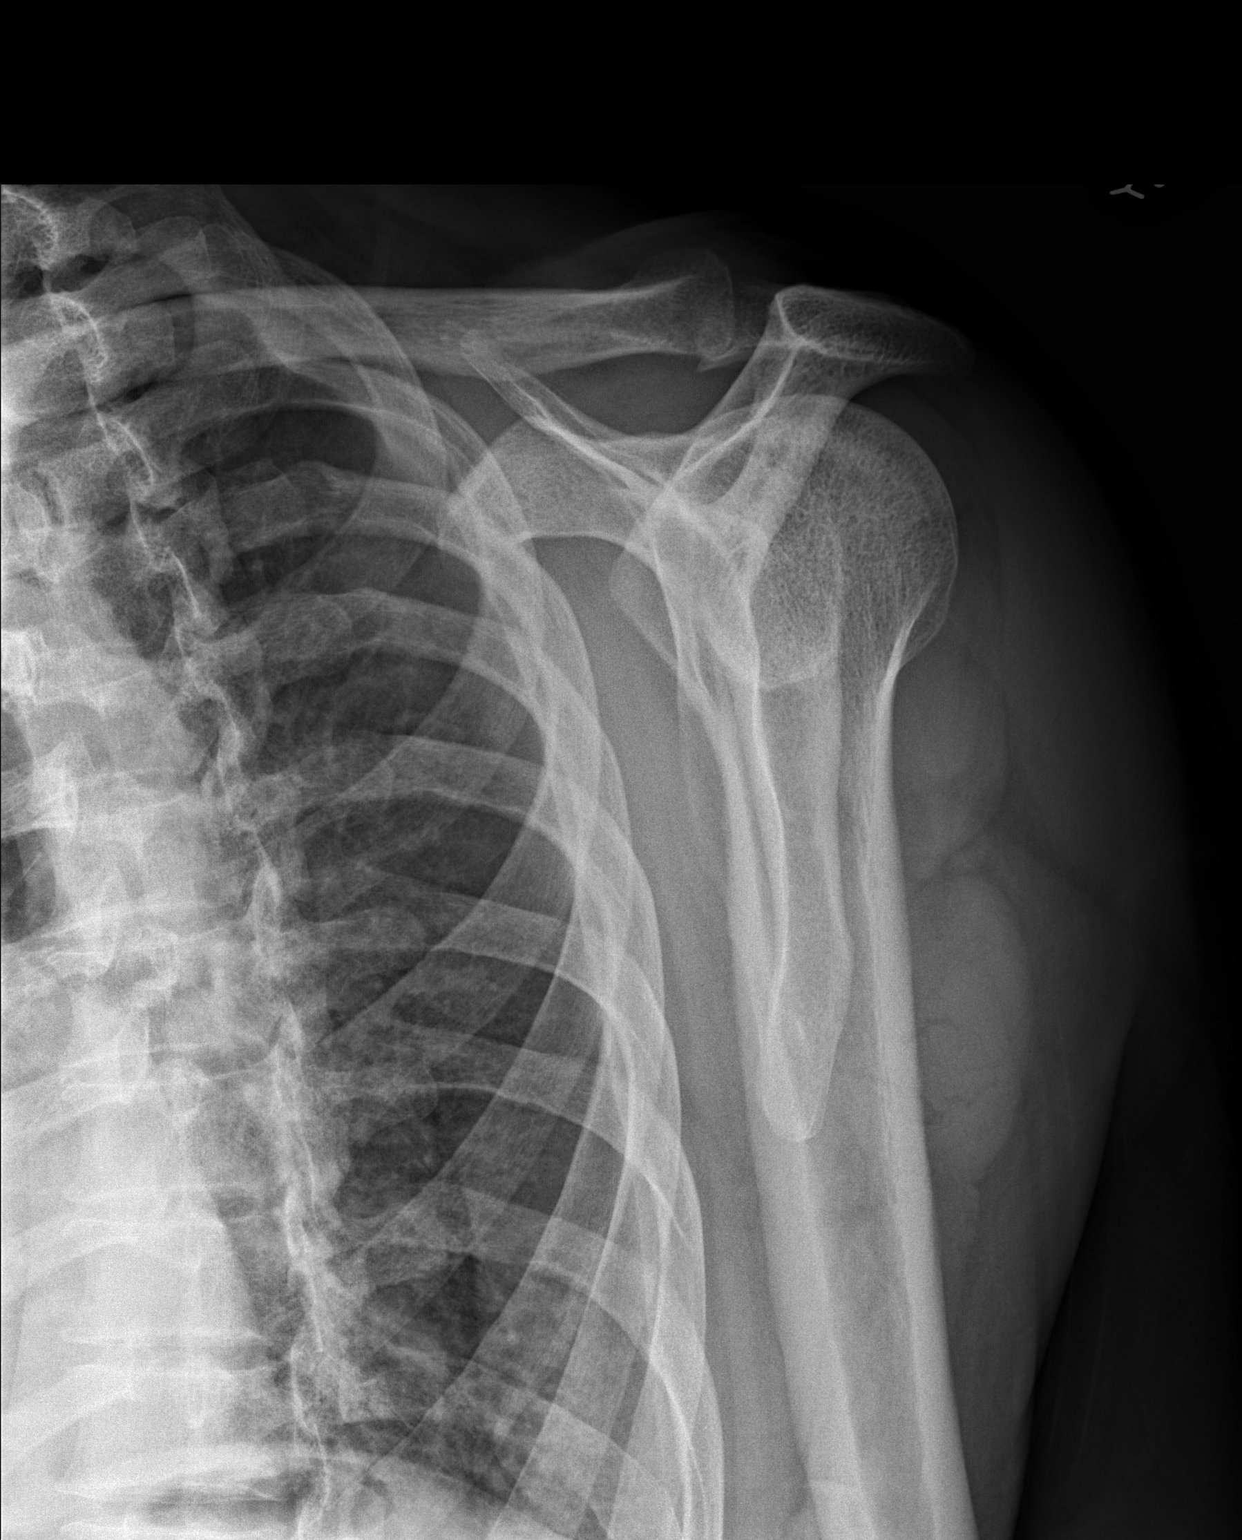

[3 of 3 positions shown; findings below may reference images not displayed]

FINDINGS: There is minimal degenerative change of the acromioclavicular joint. No
acute fracture or dislocation.
IMPRESSION: No acute fracture or dislocation.

[REDACTED]

## 2013-12-29 ENCOUNTER — Emergency Department: Payer: Self-pay | Admitting: Emergency Medicine

## 2013-12-30 LAB — URINALYSIS, COMPLETE
Bacteria: NONE SEEN
Bilirubin,UR: NEGATIVE
Blood: NEGATIVE
Glucose,UR: NEGATIVE mg/dL (ref 0–75)
Ketone: NEGATIVE
LEUKOCYTE ESTERASE: NEGATIVE
Nitrite: NEGATIVE
PROTEIN: NEGATIVE
Ph: 5 (ref 4.5–8.0)
RBC,UR: NONE SEEN /HPF (ref 0–5)
Specific Gravity: 1.011 (ref 1.003–1.030)
Squamous Epithelial: <1
WBC UR: <1 /HPF (ref 0–5)

## 2013-12-30 LAB — DRUG SCREEN, URINE
Amphetamines, Ur Screen: NEGATIVE (ref ?–1000)
BARBITURATES, UR SCREEN: NEGATIVE (ref ?–200)
Benzodiazepine, Ur Scrn: POSITIVE (ref ?–200)
Cannabinoid 50 Ng, Ur ~~LOC~~: POSITIVE (ref ?–50)
Cocaine Metabolite,Ur ~~LOC~~: POSITIVE (ref ?–300)
MDMA (Ecstasy)Ur Screen: NEGATIVE (ref ?–500)
Methadone, Ur Screen: NEGATIVE (ref ?–300)
OPIATE, UR SCREEN: NEGATIVE (ref ?–300)
Phencyclidine (PCP) Ur S: NEGATIVE (ref ?–25)
Tricyclic, Ur Screen: NEGATIVE (ref ?–1000)

## 2013-12-30 LAB — CBC
HCT: 44.2 % (ref 40.0–52.0)
HGB: 15.2 g/dL (ref 13.0–18.0)
MCH: 37.6 pg — ABNORMAL HIGH (ref 26.0–34.0)
MCHC: 34.4 g/dL (ref 32.0–36.0)
MCV: 109 fL — ABNORMAL HIGH (ref 80–100)
PLATELETS: 170 10*3/uL (ref 150–440)
RBC: 4.05 10*6/uL — ABNORMAL LOW (ref 4.40–5.90)
RDW: 12.2 % (ref 11.5–14.5)
WBC: 11.5 10*3/uL — ABNORMAL HIGH (ref 3.8–10.6)

## 2013-12-30 LAB — COMPREHENSIVE METABOLIC PANEL
ALBUMIN: 3.9 g/dL (ref 3.4–5.0)
ANION GAP: 8 (ref 7–16)
Alkaline Phosphatase: 63 U/L
BUN: 5 mg/dL — ABNORMAL LOW (ref 7–18)
Bilirubin,Total: 0.1 mg/dL — ABNORMAL LOW (ref 0.2–1.0)
CO2: 24 mmol/L (ref 21–32)
Calcium, Total: 8.5 mg/dL (ref 8.5–10.1)
Chloride: 107 mmol/L (ref 98–107)
Creatinine: 0.98 mg/dL (ref 0.60–1.30)
EGFR (African American): >60
EGFR (Non-African Amer.): >60
Glucose: 106 mg/dL — ABNORMAL HIGH (ref 65–99)
Osmolality: 275 (ref 275–301)
Potassium: 3.8 mmol/L (ref 3.5–5.1)
SGOT(AST): 57 U/L — ABNORMAL HIGH (ref 15–37)
SGPT (ALT): 51 U/L (ref 12–78)
SODIUM: 139 mmol/L (ref 136–145)
Total Protein: 7.8 g/dL (ref 6.4–8.2)

## 2013-12-30 LAB — LIPASE, BLOOD: Lipase: 448 U/L — ABNORMAL HIGH (ref 73–393)

## 2013-12-30 LAB — ETHANOL
ETHANOL LVL: 224 mg/dL
ETHANOL LVL: 64 mg/dL
Ethanol %: 0.224 % — ABNORMAL HIGH (ref 0.000–0.080)
Ethanol %: 0.064 % (ref 0.000–0.080)

## 2013-12-30 LAB — ACETAMINOPHEN LEVEL

## 2013-12-30 LAB — SALICYLATE LEVEL: Salicylates, Serum: 3.9 mg/dL — ABNORMAL HIGH

## 2014-11-09 ENCOUNTER — Emergency Department
Admit: 2014-11-09 | Disposition: A | Discharge status: Home / Self Care | Payer: Self-pay | Admitting: Emergency Medicine

## 2014-11-24 NOTE — Consult Note (Signed)
Chief Complaint:   Subjective/Chief Complaint Feels better but continues to complain of abdominal pain. No vomiting.   VITAL SIGNS/ANCILLARY NOTES: **Vital Signs.:   18-Jul-13 18:03   Vital Signs Type Q 4hr   Temperature Temperature (F) 98.7   Celsius 37   Temperature Source Oral   Pulse Pulse 86   Respirations Respirations 20   Systolic BP Systolic BP 168   Diastolic BP (mmHg) Diastolic BP (mmHg) 79   Mean BP 108   Pulse Ox % Pulse Ox % 98   Oxygen Delivery Room Air/ 21 %   Lab Results: Routine Chem:  18-Jul-13 04:20    Lipase  1369 (Result(s) reported on 22 Feb 2012 at 04:56AM.)   Assessment/Plan:  Assessment/Plan:   Assessment Pancreatitis, clinically improving. Psudocysts.    Plan Will keep him NPO except ice chips today and try clear liquids in am. Probable DC in 48-72 hours on liquid diet. Dr. Bluford Kaufmannh will see him tomorrow and Dr. Ricki RodriguezSkulski wil follow over the weekend.   Electronic Signatures: Lurline DelIftikhar, Olden Klauer (MD)  (Signed [phone]-Jul-13 21:13)  Authored: Chief Complaint, VITAL SIGNS/ANCILLARY NOTES, Lab Results, Assessment/Plan   Last Updated: 18-Jul-13 21:13 by Lurline DelIftikhar, Janisa Labus (MD)

## 2014-11-24 NOTE — Consult Note (Signed)
Pt seen and examined. Please see Thomas Holland's notes. Recurrent pancreatitis. Abnormal CT in the past. [facility]. Lipase still high. NPO except for meds rest of today. If lipase improves, can start with clears 1st and panc enzyme supplements. Will need EUS arranged by primary MD once patient ready for discharge. Will follow. Thanks.  Electronic Signatures: Lutricia Feilh, Rajesh Wyss (MD)  (Signed on 01-Aug-13 15:40)  Authored  Last Updated: 01-Aug-13 15:40 by Lutricia Feilh, Khylie Larmore (MD)

## 2014-11-24 NOTE — Consult Note (Signed)
Chief Complaint:   Subjective/Chief Complaint Passing gas. Pain somewhat better.   VITAL SIGNS/ANCILLARY NOTES: **Vital Signs.:   15-Jul-13 17:53   Vital Signs Type Q 4hr   Temperature Temperature (F) 98.1   Celsius 36.7   Temperature Source Oral   Pulse Pulse 104   Respirations Respirations 18   Systolic BP Systolic BP 358   Diastolic BP (mmHg) Diastolic BP (mmHg) 95   Mean BP 119   Pulse Ox % Pulse Ox % 97   Brief Assessment:   Additional Physical Exam Positive bowel sounds. Mild epigastric guarding and tenderness.   Lab Results: Routine Chem:  15-Jul-13 05:13    Glucose, Serum 88   BUN  4   Creatinine (comp) 0.75   Sodium, Serum 143   Potassium, Serum 3.7   Chloride, Serum  108   CO2, Serum 28   Calcium (Total), Serum 8.5   Anion Gap 7   Osmolality (calc) 281   eGFR (African American) >60   eGFR (Non-African American) >60 (eGFR values <48m/min/1.73 m2 may be an indication of chronic kidney disease (CKD). Calculated eGFR is useful in patients with stable renal function. The eGFR calculation will not be reliable in acutely ill patients when serum creatinine is changing rapidly. It is not useful in  patients on dialysis. The eGFR calculation may not be applicable to patients at the low and high extremes of body sizes, pregnant women, and vegetarians.)   Lipase  1321 (Result(s) reported on 19 Feb 2012 at 05:43AM.)   Assessment/Plan:  Assessment/Plan:   Assessment Pancreatitis, improving.    Plan Clear liquid diet. Will follow.   Electronic Signatures: IJill Side(MD)  (Signed 15-Jul-13 19:07)  Authored: Chief Complaint, VITAL SIGNS/ANCILLARY NOTES, Brief Assessment, Lab Results, Assessment/Plan   Last Updated: 15-Jul-13 19:07 by IJill Side(MD)

## 2014-11-24 NOTE — Consult Note (Signed)
Brief Consult Note: Diagnosis: Acute pancreatitis in the setting of chronic pancreatitis. Secondary to chronic alcohol use.  History of alcohol abuse as well as polysubstance abuse.   Consult note dictated.   Discussed with Attending MD.   Comments: Patient's presentation discussed with Dr. Lutricia FeilPaul Oh.  Recommendation at this time is to change diet from regular diet to NPO status.  Lipase level has increased since being in the hospital.  He was given regular diet tray for breakfast this morning.  Ate pancakes.  He needs to be NPO and will advance as tolerated and basis on his status.  Continue with IV hydration and pain management.  Will continue to monitor laboratory studies and hemodynamic status.  When patient is advanced to clear liquid diet recommend pancreatic enzyme therapy to be intiated.  Creon 24, 000 units lipase capsules with directions of three capsules with meals and two with snacks.  Management of chronic pancreatitis.  PCP will need to proceed with referral and scheduling on an out patient basis of EUS being done due to history pancreatic pseudocyst.  History of chronic pancreatitis.  Continued avoidance of alcohol..  Electronic Signatures: Rodman KeyHarrison, Gustaf Mccarter S (NP)  (Signed 01-Aug-13 15:21)  Authored: Brief Consult Note   Last Updated: 01-Aug-13 15:21 by Rodman KeyHarrison, Manvir Thorson S (NP)

## 2014-11-24 NOTE — Consult Note (Signed)
Chief Complaint:   Subjective/Chief Complaint Patient is not in his room. Case discussed with Dr. Allena KatzPatel earlier and it appears that the patient is doing better. Will re-evaluate in am.   Electronic Signatures: Lurline DelIftikhar, Kerah Hardebeck (MD)  (Signed 16-Jul-13 17:55)  Authored: Chief Complaint   Last Updated: 16-Jul-13 17:55 by Lurline DelIftikhar, Kerline Trahan (MD)

## 2014-11-24 NOTE — Discharge Summary (Signed)
PATIENT NAME:  Thomas Holland, Thomas Holland MR#:  [id number] DATE OF BIRTH:  06-01-1966  DATE OF ADMISSION:  03/07/2012 DATE OF DISCHARGE:  03/08/2012  PRIMARY CARE PHYSICIAN:  Dr. Angus Palms    DISCHARGE DIAGNOSES:  1. Acute pancreatitis.  2. Dehydration.  3. Alcohol abuse.  4. Hypertension, well controlled.  5. Chronic obstructive pulmonary disease, stable.  6. Tobacco abuse.   CONSULTS: Dr. Bluford Kaufmann of gastroenterology.   IMAGING STUDIES: Ultrasound of the abdomen showed a heterogeneous mass in the head of the pancreas. Normal common bile duct, spleen, and gallbladder.   ADMITTING HISTORY AND PHYSICAL: Please see detailed History and Physical dictated on 03/07/2012. In brief, 49 year old male patient with history of pancreatitis and pseudocyst presented to the Emergency Room complaining of severe epigastric abdominal pain, nausea, vomiting, and inability to keep any food down. The patient was recently hospitalized previously from 07/13 to 07/20 for acute pancreatitis with a diagnosis of pseudocyst of the pancreas and was advised on an endoscopic ultrasound as an outpatient, which the patient did not follow up with. The patient with lipase of 3000 was admitted to the hospitalist service for further management and treatment.   HOSPITAL COURSE:  1. Acute pancreatitis: This was thought likely secondary to the patient's alcohol abuse. Dr. Bluford Kaufmann of gastroenterology was consulted who suggested an endoscopic ultrasound for followup of the pancreatic pseudocyst. The patient's lipase slowly trended down and on the day of discharge, the patient is tolerating his food. His pain is persistent, but significantly improved. He is afebrile and is being discharged home in stable condition. On the day of discharge, his lipase has trended down to 900 from 3300.   2. Alcohol abuse, tobacco abuse: The patient was counseled to stay away from alcohol to prevent further problems with his pancreas.  He was also counseled to quit  smoking and offered a nicotine patch.  3. Hypertension and chronic obstructive pulmonary disease, stable during the hospital stay on home medications and inhalers.   DISCHARGE MEDICATIONS:  1. Tramadol 50 mg oral every six hours as needed for pain.  2. Vitamin B12 1000 mcg oral once a day.  3. Flexeril 10 mg oral 3 times a day.  4. Clonazepam 1 mg oral once a day.  5. Viagra 50 mg oral once a day as needed.  6. Protonix 40 mg oral once a day.  7. Trazodone 100 mg oral once a day at bedtime.  8. Fluticasone 50 mcg inhaled, two sprays in each nostril.  9. Lovastatin 1 tablet oral once a day.  10. Metoprolol tartrate 50 mg oral once a day.  11. Qvar 40 mcg, 2 puffs inhaled 2 times a day.  12. Claritin 10 mg oral once a day.  13. Albuterol 2 puffs oral every 4 to 6 hours for shortness of breath or wheezing as needed.   DISCHARGE INSTRUCTIONS:  1. Follow up with [facility] gastroenterology for an endoscopic ultrasound.  2. Low salt, low fat diet. 3. Activity as tolerated.  4. The patient is to return to the Emergency Room if he has any fever, worsening abdominal pain, or intractable nausea or vomiting. This plan was discussed with the patient who verbalized understanding and is okay with the plan.   TIME SPENT: Time spent today on this discharge dictation along with coordinating care and counseling of the patient was 38 minutes.  ____________________________ Molinda Bailiff Nomi Rudnicki, MD srs:bjt D:  03/08/2012 12:52:46 ET         T: 03/08/2012 14:37:16 ET  JOB#: [id number]  cc: Angus PalmsSionne George, MD Orie FishermanSRIKAR R Aniqa Hare MD ELECTRONICALLY SIGNED 03/19/2012 0:20

## 2014-11-24 NOTE — H&P (Signed)
PATIENT NAME:  Thomas Holland, Thomas Holland MR#:  [id number] DATE OF BIRTH:  [birth year]  DATE OF ADMISSION:  03/07/2012  PRIMARY CARE PHYSICIAN:  Dr. Angus Palms.  CHIEF COMPLAINT: Abdominal pain.   HISTORY OF PRESENT ILLNESS: A 49 year old male with history of pancreatitis who presents with four hours duration of epigastric abdominal pain. The patient notes that he was in his usual state of health. He went out and ate some chicken livers, took a nap and then woke up with epigastric pain associated with nausea that radiates downward into abdomen similar to prior pancreatitis episodes.There was no fever. He took some Phenergan to relieve his symptoms, but there was minimal relief so he presented to the Emergency Department. He denies emesis as well. Of note, the patient was recently hospitalized July 13th through the 20th for acute pancreatitis. During that admission, a CT of the abdomen showed a 2.5 x 2 cm area in the head of the pancreas concerning for a possible mass versus a cystic lesion with some surrounding lymphadenopathy. It was presumed to be a pancreatic pseudocyst. However, there was concern for underlying malignancy, so endoscopic ultrasound was recommended for further evaluation. However, the patient has not followed with gastroenterology as of yet. He states that his primary gastroenterologist is at Gastro[facility] at [facility].   The patient does have an extensive history of alcohol abuse. He notes that his last drink was July 10th and he has not had anything to drink since discharge. Last hospitalization was in 02/2012 for acute pancreatitis secondary to alcohol abuse. He has had multiple hospitalizations for pancreatitis.   PAST MEDICAL HISTORY:  1. Recurrent pancreatitis.  2. Pancreatic pseudocyst.  3. Hypertension. 4. Tobacco abuse.  5. Gastroesophageal reflux disease.  6. Anxiety.  7. Chronic obstructive pulmonary disease.  8. 2.5 x 2 cm mass of the pancreatic head. Cystic lesion versus  malignancy.  9. Soft tissue pericystic mass in the suprapubic area status post biopsy at [facility] around the first week of July. The patient notes that he is pending a repeat biopsy as initial biopsies only demonstrated necrotic tissue.  10. History of gastroesophageal reflux disease.  11. History of hyperlipidemia. 12. History of anxiety. 13. History of arthritis. 14. History of right low back muscle spasm intermittent.   PAST SURGICAL HISTORY: Biopsy of soft tissue mass in the pelvis.   ALLERGIES: HYDROCHLOROTHIAZIDE REACTION UNKNWON. PRILOSEC CAUSES HIVES.  MEDICATIONS:  1. Vitamin B12 1000 mcg daily.  2. Viagra 50 mg 1 tablet orally one hour prior to need.  3. Trazodone 100 mg 1.5 tablets nightly.  4. Tramadol 50 mg 3 tablets for a total of 150 mg daily in the morning as needed for pain.  5. Qvar 40 mcg inhaled 2 puffs inhaled twice a day.  6. Protonix 40 mg daily.  7. Zofran 4 mg orally every six hours as needed for nausea.  8. Metoprolol 50 mg daily.  9. Lovastatin 20 mg daily.  10. Loratadine 10 mg nightly.  11. Fluticasone two sprays each nostril daily as needed for allergies.  12. Flexeril 10 mg 3 times a day.  13. Clonazepam 1 mg daily.  14. Beconase AQ two sprays each nostril 2 times a day.  15. Albuterol inhaler 2 puffs every 4 to 6 hours inhaled as needed for shortness of breath.  16. Acetaminophen/oxycodone 325/5 mg 1 tablet every six hours as needed for pain.   FAMILY HISTORY: Known for father with atrial fibrillation, status post pacemaker. Denies history of myocardial infarction, cerebrovascular accident  in immediate family. Maternal grandmother did have cerebrovascular accident. He has one sibling that is healthy. Father also had history of alcohol abuse. Mother is healthy at age 48.   SOCIAL HISTORY: The patient is divorced. Admits to smoking a pack a day for at least 30 years. Does have history of alcohol abuse current. Last drink was July 10th. Has prior history of  alcohol withdrawal but none recently. He works as a Education administratorpainter.   REVIEW OF SYSTEMS: CONSTITUTIONAL: Positive for a 20 pound weight loss in the last two months. Denies fevers or fatigue. EYES: Denies change in vision or inflammation. ENT: Denies ear pain. Admits to postnasal drip. Respiration. Denies cough, wheeze, dyspnea. CARDIOVASCULAR: Denies chest pain, orthopnea, edema, or palpitations. Admits of nausea, but denies vomiting or diarrhea. Does have abdominal pain today. Admits to intermittent constipation since discharge. GU: Denies dysuria, hematuria, or frequency. ENDOCRINE: Admits to intermittent night sweats. States "occasionally happens a few times a week". INTEGUMENTARY: No new skin rashes. The patient's skin is tanned currently but he does admit that he has been out in the sun during the season. MUSCULOSKELETAL: Intermittent right low back muscle spasm. NEUROLOGIC: Denies numbness, weakness, tremor. PSYCH: Admits to anxiety.   PHYSICAL EXAMINATION:  VITAL SIGNS: Temperature 98.5, pulse 100, respirations 18, blood pressure 145/70, sating 97% on room air.   GENERAL: Healthy-appearing Caucasian male, sitting up in the stretcher in no apparent distress, speaking in full sentences.   HEENT: Normocephalic, atraumatic. Extraocular muscles are intact. Pupils equal, round, and reactive to light and accommodation. Normal external ears and nares. Dry mucous membranes. No oral lesions. Posterior oropharynx is clear without exudate.   NECK: Supple without lymphadenopathy. No thyromegaly. No JVD.   CARDIOVASCULAR: Normal S1, S2, but tachycardic.   LUNGS: Clear to auscultation bilaterally. No wheezes, rales, or rhonchi.   ABDOMEN: Normal bowel sounds. Mild epigastric tenderness on palpation. Otherwise, other quadrants are benign. Abdomen is nondistended. There is no rebound or guarding.   EXTREMITIES: No clubbing, cyanosis, or edema. 2+ pedal pulses.   NEUROLOGIC: Nonfocal exam. Sensation is intact.    LABORATORY DATA: His WBC count 9.9, hemoglobin 12.5, hematocrit 36.9, platelet count 418, MCV of 105. Glucose 105, BUN 9, creatinine 0.93, sodium 141, potassium 3.9, chloride 105, bicarbonate 27, calcium 9.3, bilirubin 0.1, alkaline phosphatase 76, ALT 14, AST 18, total protein 7.9, albumin 3.5, osmolality 280, anion gap of 9, lipase is greater than 3,000 today. On prior admission he presented with a lipase of 3,000 as well. It increased to about 4,000 during that stay. However, on day of discharge it had decreased to 873. Urinalysis negative for nitrites and leukocytes. There are two blood cells and one white blood cell.   ASSESSMENT AND PLAN: 49 year old male with history of pancreatitis presenting with abdominal pain, elevated lipase consistent with recurrent acute pancreatitis.  1. Acute pancreatitis. Will check abdominal ultrasound. It seems this might have been precipitated by food. The patient denies current alcohol use. This also might be due to his pancreatic pseudocyst. At this time we will provide pain management. Place the patient n.p.o. and hydrate aggressively. We will consult gastroenterology for any additional recommendations particularly endoscopic ultrasound to assess for any possible underlying malignancy. We will check fasting lipid panel to assess for hypertriglyceridemia. We will provide antiemetics as well. If symptoms worsen will check CT abdomen. 2. Hypertension. We will continue his home medications with metoprolol.  3. Gastroesophageal reflux disease. We will continue Protonix.  4. Alcohol abuse. The patient last drink  was reportedly 07/10. Will monitor for now. Hold off on CIWA.  5. Tobacco abuse. Will provide a nicotine patch.  6. Chronic obstructive pulmonary disease, stable currently. Continue inhalers p.r.n.  7. Microcytic anemia, most likely secondary to alcohol abuse. Continue B23 supplementation once the patient is taking p.o.  8. Deep vein thrombosis prophylaxis with  Lovenox.   CODE STATUS: FULL CODE.   Decision-makers in the event that he has no capacity are his mother, Ms. Linden Dolin [facility] or his girlfriend, Ms. Kerman Passey. This was discussed with the patient as well as both of these individuals who are present during the exam.   TIME SPENT: 45 minutes.    Thank you for involving [place] in the care of this patient.   ____________________________ Aurther Loft, DO aeo:ap D: 03/07/2012 03:46:51 ET T: 03/07/2012 10:15:05 ET JOB#: [id number]  cc: Aurther Loft, DO, <Dictator> Angus Palms, MD Lurline Del, MD  Aalaysia Liggins E Izaya Netherton DO ELECTRONICALLY SIGNED 03/21/2012 0:36

## 2014-11-24 NOTE — Consult Note (Signed)
Brief Consult Note: Diagnosis: Alcohol dependence.   Patient was seen by consultant.   Consult note dictated.   Recommend further assessment or treatment.   Comments: Psychiatry: Patient seen. Chart reviewed. Patient with longstanding alcohol dependence with potentially life-threatening complications. Has never been in any treatment. Education done. His attitude seems a little cavalier about sobriety but that may or may not represent his real motivation. He definately does not want inpatient treatment. Provided mother and girlfriend with information about [facility]. Contact is [facility] [address] [place], [place] [phone].  Electronic Signatures: Audery Amellapacs, Miyeko Mahlum T (MD)  (Signed 19-Jul-13 18:06)  Authored: Brief Consult Note   Last Updated: 19-Jul-13 18:06 by Audery Amellapacs, Lodie Waheed T (MD)

## 2014-11-24 NOTE — Consult Note (Signed)
Chief Complaint:   Subjective/Chief Complaint Pateint had more pain this morning. Lipase wnt up to 4000. 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  Recommendations: NPO. Will continue to follow clinically.   Electronic Signatures: Lurline DelIftikhar, Jearldine Cassady (MD)  (Signed 17-Jul-13 18:36)  Authored: Chief Complaint   Last Updated: 17-Jul-13 18:36 by Lurline DelIftikhar, Branden Vine (MD)

## 2014-11-24 NOTE — Consult Note (Signed)
Chief Complaint:   Subjective/Chief Complaint Pain much better. Lipase dropped. Tolerated diet. TO go home today. Reviewed [facility] EGD report. Esophagitis and mild duodenitis.   VITAL SIGNS/ANCILLARY NOTES: **Vital Signs.:   02-Aug-13 05:04   Vital Signs Type Routine   Temperature Temperature (F) 98.1   Celsius 36.7   Temperature Source Oral   Pulse Pulse 94   Respirations Respirations 20   Systolic BP Systolic BP 035   Diastolic BP (mmHg) Diastolic BP (mmHg) 58   Mean BP 74   Pulse Ox % Pulse Ox % 94   Pulse Ox Activity Level  At rest   Oxygen Delivery Room Air/ 21 %   Brief Assessment:   Cardiac Regular    Respiratory clear BS    Gastrointestinal mild abd tenderness   Lab Results: Routine Chem:  02-Aug-13 04:44    Lipase  903 (Result(s) reported on 08 Mar 2012 at 06:51AM.)   Glucose, Serum 77   BUN  3   Creatinine (comp) 0.71   Sodium, Serum 144   Potassium, Serum 3.6   Chloride, Serum  111   CO2, Serum 26   Calcium (Total), Serum  8.4   Anion Gap 7   Osmolality (calc) 282   eGFR (African American) >60   eGFR (Non-African American) >60 (eGFR values <60m/min/1.73 m2 may be an indication of chronic kidney disease (CKD). Calculated eGFR is useful in patients with stable renal function. The eGFR calculation will not be reliable in acutely ill patients when serum creatinine is changing rapidly. It is not useful in  patients on dialysis. The eGFR calculation may not be applicable to patients at the low and high extremes of body sizes, pregnant women, and vegetarians.)   Cholesterol, Serum 120   Triglycerides, Serum 91   HDL (INHOUSE)  23   VLDL Cholesterol Calculated 18   LDL Cholesterol Calculated 79 (Result(s) reported on 08 Mar 2012 at 06:51AM.)  Routine Hem:  02-Aug-13 04:44    WBC (CBC) 7.4   RBC (CBC)  3.14   Hemoglobin (CBC)  11.3   Hematocrit (CBC)  32.9   Platelet Count (CBC) 307   MCV  105   MCH  36.0   MCHC 34.3   RDW 13.2   Neutrophil % 52.4    Lymphocyte % 31.3   Monocyte % 11.5   Eosinophil % 4.3   Basophil % 0.5   Neutrophil # 3.9   Lymphocyte # 2.3   Monocyte # 0.9   Eosinophil # 0.3   Basophil # 0.0 (Result(s) reported on 08 Mar 2012 at 06:44AM.)   Assessment/Plan:  Assessment/Plan:   Assessment Pancreatitis. Resolving.    Plan Ok for discharge. Low fat diet. Pancreatic enzyme supplement. Daily PPI. [facility]. WIll sign off. Thanks.   Electronic Signatures: OVerdie Shire(MD)  (Signed [phone])  Authored: Chief Complaint, VITAL SIGNS/ANCILLARY NOTES, Brief Assessment, Lab Results, Assessment/Plan   Last Updated: 02-Aug-13 12:38 by OVerdie Shire(MD)

## 2014-11-24 NOTE — Discharge Summary (Signed)
PATIENT NAME:  Thomas Holland, Thomas Holland MR#:  [id number] DATE OF BIRTH:  [birth year]  DATE OF ADMISSION:  02/17/2012 DATE OF DISCHARGE:  02/24/2012  For a detailed note, please see the History and Physical on admission by Dr. Darrick Huntsman.   DIAGNOSES AT DISCHARGE:  1. Acute pancreatitis secondary to alcohol abuse.  2. Pancreatic pseudocyst.  3. Hypertension.  4. Tobacco abuse. 5. Gastroesophageal reflux disease.  6. Anxiety.  7. Chronic obstructive pulmonary disease.   DIET: The patient is being discharged on a clear liquid diet to be advanced to a low-fat diet over the next week or so.   CONSULTANTS:  1. Dr. Niel Hummer from gastroenterology.  2. Dr. Mordecai Rasmussen from psychiatry.   PERTINENT STUDIES DURING HOSPITAL COURSE: CT scan of the abdomen and pelvis done with contrast on 07/13 showing CT findings consistent with pancreatitis. Underlying mass within the apical portion of the head of the pancreas is also a diagnostic concern. Stable mass within the pelvis. A repeat CT scan of the abdomen done with contrast on 02/21/2012 showed inflammatory changes surrounding the pancreatic head and body have decreased from prior. However, there is a new increased size of small fluid collections and low attenuation lesions in the region of the pancreatic head which extend superiorly. The largest is seen insinuating superior adjacent to the intrahepatic inferior vena cava causing mass effect on the inferior vena cava. These most likely represent pseudocysts given the adjacent pancreatitis.   HOSPITAL COURSE: This is a 49 year old male with medical problems as mentioned above who presented to the hospital on 02/17/2012 due to abdominal pain, nausea, and vomiting, and noted to have acute pancreatitis.   1. Acute pancreatitis: The patient was admitted to the hospital and started on aggressive IV fluids, antiemetics, and pain control. He actually was improving on conservative therapy and was going to be probably discharged  on 02/21/2012 when suddenly he started having worsening abdominal pain. The patient underwent a repeat CT of the scan of his belly which showed pseudocyst and his lipase had increased. A GI consult was obtained and the patient was managed conservatively with these pseudocysts. He was made n.p.o. again and restarted back on IV fluids and pain control. His lipase since then has nicely trended down from greater than 3000 down to the low  800's range. His abdominal pain has significantly improved.  He denies any nausea or vomiting. He is tolerating a clear liquid diet well. He currently is being discharged on a clear liquid diet to advance to a full, eventually to a low-fat diet over the next few days as an outpatient. He was told that if he has any further exacerbations of his pain, nausea, or vomiting to come back to the hospital. He agrees to this plan presently. He is being discharged on a bit of Norco and Zofran for pain control and nausea and vomiting.  2. Pancreatic pseudocyst: As mentioned, this was noted on a CT scan repeated on 07/17. His abdomen is not severely tender. He is not having any further nausea or vomiting. This does not require any surgical intervention or any drainage at this point as per GI and this can be further followed. He has been strongly advised to abstain from alcohol.  3. History of alcohol abuse: The patient was maintained on a CIWA protocol while in the hospital. He has not shown any signs of alcohol withdrawal. He was seen by psychiatry and referred to outpatient detox as per Dr. Toni Amend.  4. Hypertension: The patient has  remained hemodynamically stable during the hospital course.  He will resume his metoprolol upon discharge.  5. Chronic obstructive pulmonary disease: Again he was strongly advised to quit smoking. He was maintained on his Qvar along with his Flonase and he will continue  that.  6. Hyperlipidemia: The patient was maintained on his lovastatin and will continue  that.  7. Gastroesophageal reflux disease: The patient was maintained on his Protonix he will continue  that.  8. Anxiety: The patient was maintained on his Klonopin and he will continue that upon discharge, too.   CODE STATUS: The patient is a FULL CODE.   TIME SPENT: 40 minutes.  ____________________________ Rolly PancakeVivek J. Cherlynn KaiserSainani, MD vjs:bjt D:  02/24/2012 12:53:36 ET         T: 02/26/2012 11:11:33 ET         JOB#: [id number]  Houston SirenVIVEK J Kiona Blume MD ELECTRONICALLY SIGNED 03/01/2012 16:18

## 2014-11-24 NOTE — Consult Note (Signed)
Chief Complaint:   Subjective/Chief Complaint Covering for Dr. Niel HummerIftikhar. Feeling better. Started clears this AM. So far, tolerating it. Abd pain better.   VITAL SIGNS/ANCILLARY NOTES: **Vital Signs.:   19-Jul-13 10:04   Vital Signs Type Pt. not in room checked 3x   Brief Assessment:   Cardiac Regular    Respiratory clear BS    Gastrointestinal Normal   Lab Results: Routine Chem:  18-Jul-13 04:20    Lipase  1369 (Result(s) reported on 22 Feb 2012 at 04:56AM.)  19-Jul-13 04:29    Lipase  873 (Result(s) reported on 23 Feb 2012 at 05:10AM.)   Assessment/Plan:  Assessment/Plan:   Assessment Pancreatitis with pseudocysts. Clinically improving.    Plan Continue IV hydration. Advance diet slowly as tolerated. Moniter lipase levels. Consider use of panc enzyme use. Dr. Ricki RodriguezSkulski to follow this weekend. Thanks.   Electronic Signatures: Lutricia Feilh, Halton Neas (MD)  (Signed 19-Jul-13 12:53)  Authored: Chief Complaint, VITAL SIGNS/ANCILLARY NOTES, Brief Assessment, Lab Results, Assessment/Plan   Last Updated: 19-Jul-13 12:53 by Lutricia Feilh, Clotee Schlicker (MD)

## 2014-11-24 NOTE — Consult Note (Signed)
PATIENT NAME:  Thomas Holland, Thomas Holland MR#:  [id number] DATE OF BIRTH:  1966-07-04  DATE OF CONSULTATION:  03/07/2012  REFERRING PHYSICIAN:   CONSULTING PHYSICIAN:  Rodman Keyawn S. Harrison, NP  PRIMARY CARE PHYSICIAN: Dr. Angus [facility]Sionne George [facility]   REASON FOR CONSULTATION: Pancreatitis.   HISTORY OF PRESENT ILLNESS: Thomas Holland is a 49 year old Caucasian gentleman with a known history of chronic pancreatitis. He has had five to six hospitalizations this year for pancreatitis as well as two hospitalizations for gastritis. Patient has a history of alcohol abuse. He was last hospitalized on 02/17/2012 and seen by Dr. Barnetta ChapelMartin Skulskie at that time. He states he has not had any alcohol consumption since that timeframe. What led to this hospitalization was at 1:30 yesterday evening he had laid down after eating chicken livers and macaroni and cheese for lunch. States he just did not feel good after eating. At 6:30 yesterday evening he arose still not feeling well. States he is tired all the time. He has had a 20 pound weight loss over the past several months. No nausea. No vomiting. Abdominal pain has been more epigastric, mid abdomen which is normal presentation for him he states with gastritis or acute pancreatitis. Says he had taken Phenergan and Percocet at home but the pain just continued to progress, radiated distally to lower abdomen. Denies radiating through to his back or around his sides. Last bowel movement was yesterday. He has been experiencing mild constipation. He had attempted to remain on a low-fat diet at home. He has never been placed on pancreatic enzyme therapy. He was scheduled to have an endoscopic ultrasound done following his hospitalization last time in which imaging study did reveal evidence of a pancreatic pseudocyst. This has not been performed. [facility] a [facility] ago and had an EGD done. Are not aware of results. Medical release form and order obtained during  this hospitalization. States that his initial episode of acute pancreatitis was November 2002. Patient was placed on a regular diet. Order has been stopped. He was fed pancakes this morning and after that his abdomen seemingly hurt worse. On admission his lipase level was greater than 3000. It has risen after eating breakfast to a level of 3772 around noon today.   PAST MEDICAL HISTORY:  1. Recurrent pancreatitis. 2. Pancreatic pseudocyst.  3. Hypertension.  4. Tobacco abuse. 5. Gastroesophageal reflux disease. 6. Anxiety. 7. Chronic obstructive pulmonary disease.  8. A 2.5 x 2 cm mass in the pancreatic head; cystic lesion versus malignancy. Soft tissue pericystic mass in the suprapubic area status post [facility] around July 1. Pending results. Initial biopsy results revealed necrotic tissue.  9. Hyperlipidemia.  10. Anxiety.  11. Arthritis.  12. Right lower back pain/spasm intermittently.   PAST SURGICAL HISTORY: Biopsy of soft tissue mass in pelvis.   ALLERGIES: None.   MEDICATIONS AT HOME: 1. Vitamin B12 1000 mcg a day.  2. Viagra 50 mg 1 tablet one hour prior to need.  3. Trazodone 100 mg 1.5 tabs nightly. 4. Tramadol 50 mg 3 tablets for a total 150 mg in the morning and as needed for pain. 5. Qvar 40 mcg inhalation 2 puffs twice a day. 6. Protonix 40 mg a day.  7. Zofran 4 mg orally every six hours as needed for nausea.  8. Metoprolol 50 mg a day. 9. Lovastatin 20 mg a day. 10. Losartan 10 mg nightly. 11. Fluconazole 2 sprays each nostril daily as needed for  allergies.  12. Flexeril 10 mg 3 times a day. 13. Clonazepam 1 mg a day.  14. Beconase AQ two sprays each nostril twice a day. 15. Albuterol inhaler 2 puffs every four hours as needed. 16. Acetaminophen with oxycodone 325 mg/5 mg 1 tablet every six hours as needed for pain.   FAMILY HISTORY: Father history of atrial fibrillation status post pacemaker implantation. Maternal grandmother history of cerebrovascular  accident. Sibling healthy. Father history of alcohol abuse. Mother healthy at the age of 23. No history of cancer.   SOCIAL HISTORY: Divorced. Smoking a pack of cigarettes a day for 30 years. History of alcohol abuse. See history for complete details. States he has been abstinent since July. Unable to elaborate how much he was drinking though prior to him stopping. Thomas Holland.  REVIEW OF SYSTEMS: CONSTITUTIONAL: Significant for 20 pound weight loss over the past several months. No fevers. Significant for fatigue. EYES: No blurred vision. Does wear glasses. ENT: No hearing pain. Significant for postnasal drip. RESPIRATORY: No coughing, no wheezing. CARDIOVASCULAR: No chest pain. No orthopnea, edema, palpitations. GASTROINTESTINAL: See history of present illness. GENITOURINARY: Denies dysuria, hematuria, frequency. ENDOCRINE: Significant for night sweats occasionally occurring throughout the week. INTEGUMENTARY: No new rashes. No jaundice. Color is tan. MUSCULOSKELETAL: Significant for chronic back pain/spasms. NEUROLOGIC: No numbness, no tingling. No history of cerebrovascular accident or transient ischemic attack.   PHYSICAL EXAMINATION:  VITAL SIGNS: Temperature 98.5, pulse 107, respirations 20, systolic 143/79, pulse oximetry 95%.   GENERAL: Well-developed, well-nourished 49 year old Caucasian gentleman, no acute distress noted lying comfortably in bed watching television.   HEENT: Normocephalic, atraumatic. Pupils reactive to light. Conjunctiva clear. Sclerae anicteric.   NECK: Supple. Trachea midline. No lymphadenopathy, thyromegaly.   PULMONARY: Symmetric rise and fall of chest. Clear to auscultation throughout.   CARDIOVASCULAR: Regular rhythm, S1, S2. No murmurs, no gallops.   ABDOMEN: Soft, nondistended. Mild discomfort epigastrically. No masses. No bruits. Hypoactive bowel sounds.   RECTAL: Deferred.   MUSCULOSKELETAL: Moving all four extremities. No contractures. No clubbing.    NEUROLOGICAL: No gross neurological deficits.   PSYCH: Alert and oriented x4. Appropriate affect, mood.    EXTREMITIES: No edema.   LABORATORY, DIAGNOSTIC AND RADIOLOGICAL DATA: Glucose level 105 on admission otherwise chemistry panel within normal limits. Lipase was greater than 3000. Again, recheck of lipase today at 11:52, 3772 level. Hepatic panel on admission: Total bilirubin was low at 0.1, otherwise within normal limits. CBC: White count 9.9 on admission, today 8.7, hemoglobin 12.5, hematocrit 39.9; recheck today at 11:52 remained stable. Urinalysis unremarkable. Abdominal ultrasound was performed: Liver normal. Portal vein patent. No ascites. Heterogenesis mass in the head of the pancreas appears to be present. Do recommend CT for evaluation. Gallbladder is contracted. Common bile duct 4 mm in diameter. Spleen is normal. No hydronephrosis.   IMPRESSION:  1. Acute pancreatitis in the setting of chronic pancreatitis secondary to chronic alcohol use.  2. History of alcohol abuse as well as polysubstance abuse.   PLAN: Patient's presentation was discussed with Dr. Lutricia Feil. Recommendation at this time is to change diet from regular diet n.p.o. status. Lipase levels increased after being in the hospital and given a regular tray to eat. He needs to be n.p.o. Will advance diet as tolerated based on his status. Continue IV hydration and pain management. Will continue to monitor laboratory studies and hemodynamic status. When patient is advanced to clear liquid diet recommend pancreatic enzyme therapy to be initiated. Creon 24,000 unit lipase capsules with directions of  3 capsules with meals and two with snacks. Management for chronic pancreatitis. PCP will need to proceed with referral and scheduling on an outpatient basis EUS being done due to history of pancreatic pseudocyst, history of chronic pancreatitis. Continue avoidance of alcohol. Medical release form signed to obtain records from [facility]  specifically EGD which was performed a month ago.   These services were provided by myself under collaborative agreement with Dr. Lutricia Feil.   ____________________________ Rodman Key, NP dsh:cms D: 03/07/2012 15:32:51 ET Holland: 03/07/2012 16:18:40 ET  JOB#: [id number] cc: Rodman Key, NP, <Dictator>  Rodman Key MD ELECTRONICALLY SIGNED 03/11/2012 13:42

## 2014-11-24 NOTE — Consult Note (Signed)
PATIENT NAME:  Thomas Holland, Thomas Holland MR#:  [id number] DATE OF BIRTH:  Jun 04, 1966  DATE OF CONSULTATION:  02/23/2012  REFERRING PHYSICIAN:   CONSULTING PHYSICIAN:  Thomas Holland. Clapacs, MD  IDENTIFYING INFORMATION AND REASON FOR CONSULT: This is a 49 year old man with a history of alcohol dependence admitted to the hospital for pancreatitis. Consult was about substance abuse treatment.   HISTORY OF PRESENT ILLNESS: Information obtained from the patient, also from his girlfriend and mother and from the chart. This 49 year old man has a history of recurrent pancreatitis related to his alcohol dependence. He presented to the hospital with acute pain from pancreatitis. He has had multiple admissions to the hospital for recurrent pancreatitis. He had recently cut back on his alcohol use and says that he last drank some beer about a day prior to the onset of this pain. He had been trying to cut back on the amount he was drinking but had still been occasionally consuming beer, but says that it had been less than every day. Currently he is resolving symptoms of his pancreatitis. He is still having significant pain, but has been allowed to consume clear liquids. He is up and walking around the ward. He states that he very much wants to be discharged and get out of here because he intends to go back to work as soon as possible. He states that he understands that his illness is such that continued drinking is potentially fatal. He says that he definitely will stop drinking. He does not express a lot of interest in getting involved in formal substance abuse treatment.   PAST PSYCHIATRIC HISTORY: He has had admissions for detox in the past. No history of delirium tremens reported. Recurrent pancreatitis. Dr. Darrick Holland has a diagnosis listed also of generalized anxiety disorder for the patient. When I interviewed him, he stated that he had no other psychiatric history and had never been on any psychiatric medicine in the past. Denies  any history of suicidal behavior.   SUBSTANCE ABUSE HISTORY: Has been abusing alcohol all of his life. The patient only identifies his pancreatitis as being a negative impact of his alcohol. His girlfriend and mother who are present strongly disagree with this and report that they feel that alcohol has dominated his life and caused him to be in prison for extended periods of time.   SOCIAL HISTORY: The patient is a Education administratorpainter by trade. He indicates that he plans to get back to work as early as possible. His girlfriend indicates she is very concerned about this because the people he works with also drink heavily on a regular basis. He plans to be living with his girlfriend. They will try and keep alcohol out of the house. The patient has a history of some familiarity with Alcoholics Anonymous, primarily in the prison environment in the past. Does not appear to have a Alcoholic's Anonymous group or sponsor locally.   PAST MEDICAL HISTORY: The patient has recurrent pancreatitis, also has a pancreatic pseudocyst, and potential tumor waiting for biopsy. Currently suffering severe pain, dehydration.   REVIEW OF SYSTEMS: Complains of abdominal pain and some nausea. Feels anxious but says he feels optimistic and upbeat. Denies suicidal ideation.   MENTAL STATUS EXAMINATION: Neatly dressed and groomed man. Interviewed in his hospital room. Eye contact was good. Psychomotor activity normal. Affect was somewhat blunted and distant. Possibly anxious. Mood was stated as fine and good. The patient was alert and oriented x4 and appeared to be of average intelligence. Thoughts appeared generally  lucid. No sign of delusional thinking or loosening of associations. Denies suicidal or homicidal ideation. His insight and judgment may be somewhat impaired. He is convinced that he can get back to work immediately and takes a rather flippant attitude towards sobriety indicating that he thinks that he will simply be able to stop  drinking on his own and does not see the need of any assistance.   ASSESSMENT: This is a 49 year old man with alcohol dependence, lifelong, with currently life-threatening complications. He states that he intends to stop drinking and is motivated by the fact that he could die from continued drinking. All of this certainly makes sense, but he seems to minimize the difficulties he will have. I gave him some education about inpatient treatment and he is not interested at all.   TREATMENT PLAN: I suggested that we consider referral to [facility] for inpatient treatment, but he refuses this. We discussed outpatient treatment. He says that he may go to some Alcoholics Anonymous meetings but seems only partially enthusiastic about it. I educated him about the importance of fellowship and support and maintaining sobriety, discussed potential relapse triggers, and discussed the obstacles often faced in maintaining sobriety. I suggested that he consider following up with [facility], the local provider of mental health services, to have an outpatient intensive program that is geared to people without insurance. I gave his girlfriend and mother the address and telephone number and have put it in my progress note as well. We will follow up with him. If there is anything else we can do, let us know. At this point, I do not think that medication for alcohol dependence would be appropriate given the risks of potential liver toxicity with most of those medications. It might be considered longer term, but only if he was seriously following up with an appropriate psychosocial rehab program.   DIAGNOSIS PRINCIPLE AND PRIMARY:   AXIS I: Alcohol dependence.   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   AXIS II: Deferred.   AXIS III: Pancreatitis.   AXIS IV: Moderate to severe stress from illness, being out of work, and being unsure about biopsy results.   AXIS V: Functioning at time of assessment  50.   TOTAL TIME SPENT ON CONSULTATION: 50 minutes. ____________________________ Thomas Amel, MD jtc:slb D: 02/23/2012 18:16:59 ET Holland: 02/24/2012 10:34:07 ET JOB#: [id number]  cc: Thomas Amel, MD, <Dictator> Thomas Amel MD ELECTRONICALLY SIGNED 02/26/2012 11:52

## 2014-11-24 NOTE — Consult Note (Signed)
Brief Consult Note: Comments: Psychiatry: Came by to evaluate patient but he was not there and evidently had gone for a long stroll off the unit. Will retry tomorrow.  Electronic Signatures: Audery Amellapacs, John T (MD)  (Signed 18-Jul-13 19:13)  Authored: Brief Consult Note   Last Updated: 18-Jul-13 19:13 by Audery Amellapacs, John T (MD)

## 2014-11-27 NOTE — H&P (Signed)
PATIENT NAME:  Thomas Holland, Thomas Holland MR#:  [id number] DATE OF BIRTH:  September 21, 1965  DATE OF ADMISSION:  08/21/2012  REFERRING PHYSICIAN:  Dr. Lurline Hare.  PRIMARY CARE PHYSICIAN:  Recently switched physician, but still following at [facility].  CHIEF COMPLAINT:  Abdominal pain.   HISTORY OF PRESENT ILLNESS:  This is a 49 year old male with significant past medical history of recurrent pancreatitis with pancreatic pseudocyst, hypertension, tobacco abuse, gastroesophageal reflux disease, and chronic obstructive pulmonary disease, history of hyperlipidemia, presents with complaints of abdominal pain. The patient reports he is following mainly at [facility] where he has his gastroenterologist there, as well he reports he started following with a pulmonary physician as well. The patient reports he has been having dinner where he went to [facility] where he reported he had a heavy dinner where he started to have abdominal pain after that. Upon presentation to ED, the patient had lipase done which was more than 3000. The patient had previous admissions with acute recurrent pancreatitis with similar presentations in the past. The patient complains of nausea, but he denied any vomiting, as well no diarrhea, no constipation, no fever, no chills. The patient received 2 liters in ED and received 1 mg of IV Dilaudid, where he reports his abdominal pain has much improved at this point. Hospitalist service were requested to admit the patient for further treatment and management of his acute pancreatitis. The patient does have history of the past of extensive history of alcohol abuse, but he reports his last drink was last week where he had four beers at night.   PAST MEDICAL HISTORY:  1.  Recurrent pancreatitis.  2.  Pancreatic pseudocyst.  3.  Hypertension.  4.  Tobacco abuse.  5.  Gastroesophageal reflux disease.  6.  Anxiety.  7.  Chronic obstructive pulmonary disease. 8.  History of hyperlipidemia.  9.  Arthritis.    PAST SURGICAL HISTORY:  History of biopsy of soft tissue mass in the pelvis.   ALLERGIES:   1.  HYDROCHLOROTHIAZIDE.  2.  PRILOSEC.   HOME MEDICATIONS:   1.  Oxycodone 5 mg as needed.  2.  Tramadol 50 mg as needed.  3.  Clonazepam 1 tablet daily.  4.  Effexor, he cannot recall his dose.  5.  Trazodone 100 mg oral at bedtime.  6.  Promethazine 25 mg oral as needed.  7.  Lovastatin 20 mg oral daily.  8.  Metoprolol tartrate 50 mg oral daily.  9.  Albuterol as needed.  10.  Beconase two sprays nasal 2 times a day. 11.  Pantoprazole 40 mg oral daily.  12.  QVAR 40 mcg inhalation 2 times a day.   FAMILY HISTORY:  Father has atrial fibrillation status post pacemaker.  No history of coronary artery disease or CVA in the immediate family.   SOCIAL HISTORY:  The patient is divorced, admits to smoking 1 pack a day. The patient is known to have history of alcohol abuse in the past. Reports his last drink was last Tuesday which is exactly one week from today. Has history of alcohol withdrawal in the past. Works as a Curator.   REVIEW OF SYSTEMS:  CONSTITUTIONAL:  Denies any fever, chills, fatigue, weakness.  EYES:  Denies blurry vision, double vision or pain.  EARS, NOSE, THROAT:  Denies tinnitus, ear pain, hearing loss, epistaxis or discharge.  RESPIRATORY:  Denies cough, wheezing, hemoptysis, dyspnea.  CARDIOVASCULAR:  Denies any chest pain, orthopnea, edema, arrhythmia, palpitations, syncope.  GASTROINTESTINAL:  He has  complaints of nausea.  Denies vomiting, diarrhea, constipation, hematemesis, melena, coffee-ground emesis, rectal bleed.  Has complaints of abdominal pain. GENITOURINARY:  Denies dysuria, hematuria, renal colic.  ENDOCRINE:  Denies polyuria, polydipsia, heat or cold intolerance or thyroid problems.  HEMATOLOGY:  Denies anemia, easy bruising, bleeding diathesis. INTEGUMENTARY:  Denies any acne, rash or skin lesions.  MUSCULOSKELETAL:  Denies any swelling, gout, limited  activity, arthritis or cramps.  NEUROLOGIC:  Denies any numbness, weakness, dysarthria, epilepsy, tremors, vertigo, CVA or seizures.  PSYCHIATRIC:  Has history of anxiety and depression and alcohol abuse.  Denies any insomnia, schizophrenia or substance abuse.   PHYSICAL EXAMINATION: VITAL SIGNS:  Temperature 98.6, pulse 86, respiratory rate 18, blood pressure 151/70, saturating 97% on room air.  GENERAL:  Well-nourished male looks comfortable in bed in no apparent distress.  HEENT:  Head atraumatic, normocephalic.  Pupils equal, reactive to light.  Pink conjunctivae.  Anicteric sclerae.  Moist oral mucosa.  NECK:  Supple.  No thyromegaly.  No JVD.  CHEST:  Good air entry bilaterally.  No wheezing, rales, rhonchi.  CARDIOVASCULAR:  S1, S2 heard.  No rubs, murmurs or gallops. ABDOMEN:  Has some mild epigastric abdominal pain, otherwise no rebound, no guarding.  No organomegaly.  Bowel sounds present.  EXTREMITIES:  No edema.  No clubbing.  No cyanosis.  PSYCHIATRIC:  Appropriate affect.  Awake, alert x 3.  Intact judgment and insight.  SKIN:  Normal skin turgor.  Warm and dry.  NEUROLOGIC:  Cranial nerves grossly intact.  Motor V/V.   PERTINENT LABORATORY DATA:  Glucose 91, BUN 8, creatinine 0.73, sodium 140, potassium 3.8, chloride 106, CO2 28.  Lipase more than 3000.  Total bili 0.3, alk phos 75, AST 18, ALT 15.  Troponin less than 0.02.  White blood cells 12.4, hemoglobin 13.5, hematocrit 38.3, platelets 177.   ASSESSMENT AND PLAN: 1.  Acute pancreatitis, patient has acute recurrent pancreatitis, it appears to be precipitated by food, as well the patient has significant history of alcohol abuse, but denies any recent alcohol abuse.  His last drink was last week, and as well it might be due to his pancreatic pseudocyst. At this time we will have patient on aggressive IV hydration.  He received 2 liters in the ED.  We will give him 1 more liter and then we will start him at 150 mL/h, given the  fact he has only nausea with no vomiting we will have him on clear liquid diet.  We will have him on Zofran for nausea as well, the patient's calcium within normal limits, we will recheck lipid panel especially given the fact that he is having hyperlipidemia.  2.  Hypertension.  We will continue patient at his home medication with metoprolol, I feel is acceptable.  3.  Gastroesophageal reflux disease.  Continue with PPI Protonix.  4.  History of alcohol abuse, patient reports last drink was last week.  We will continue him on CIWA protocol.  5.  Tobacco abuse.  The patient was counseled.  We will have him on nicotine patch.  6.  Chronic obstructive pulmonary disease.  No wheezing, currently stable.  We will have him on as needed albuterol and resume on his home medication.   7.  Deep vein thrombosis prophylaxis.  Subcutaneous heparin.  8.  CODE STATUS:  FULL CODE.   TOTAL TIME SPENT ON ADMISSION AND PATIENT CARE:  Fifty-five minutes.     ____________________________ Albertine Patricia, MD dse:ea D: 08/21/2012 04:55:20 ET T: 08/21/2012  07:00:24 ET JOB#: [id number]  cc: Albertine Patricia, MD, <Dictator> DAWOOD Graciela Husbands MD ELECTRONICALLY SIGNED 08/23/2012 7:37

## 2014-11-27 NOTE — Consult Note (Signed)
Brief Consult Note: Comments: Patient feels much better and is being discharged. Please call us back if needed.  Electronic Signatures: Lurline DelIftikhar, Naliya Gish (MD)  (Signed 18-Jan-14 11:59)  Authored: Brief Consult Note   Last Updated: 18-Jan-14 11:59 by Lurline DelIftikhar, Eean Buss (MD)

## 2014-11-27 NOTE — Discharge Summary (Signed)
PATIENT NAME:  Thomas Holland, Thomas Holland DATE OF BIRTH:  23-Mar-1966  DATE OF ADMISSION:  08/22/2012 DATE OF DISCHARGE: 08/24/2012  ADMITTING DIAGNOSIS: Acute pancreatitis.   DISCHARGE DIAGNOSES:  1.  Acute pancreatitis.  2.  Pancreatic cyst seemed to be improving on CT scan.  3.  Alcohol, tobacco abuse.  4.  Malignant hypertension.  5.  Chronic obstructive pulmonary disease.  6.  Anemia, macrocytic.   DISCHARGE CONDITION: Stable.   DISCHARGE MEDICATIONS: The patient is to resume his outpatient medications which are:  1.  Pantoprazole 40 p.o. daily.  2.  Lovastatin 20 mg p.o. daily.  3.  Metoprolol tartrate 50 p.o. daily.  4.  QVAR 2 puffs twice a day.  5.  Beconase AQ 2 sprays twice a day.  6.  Albuterol 2 puffs every 4 to 6 hours as needed.  7.  Tramadol 50 mg 4 times daily as needed.  8.  Oxycodone 10 mg p.o. every 4 hours as needed.  9.  Promethazine 25 mg every 6 hours as needed.  10.  Effexor, unknown dose, once daily.  11.  Clonazepam 1 mg p.o. daily.  12.  Trazodone 100 mg p.o. at bedtime.    13.  Lisinopril 5 mg p.o. daily.  14.  Docusate sodium 100 mg p.o. twice daily.  15.  Senna 8.6 mg once at bedtime.  15.  Nicotine transdermal patch 21 mg topically daily.  16.  Folic acid 1 mg p.o. daily.  17.  Thiamine 50 mg p.o. daily.   HOME oxygen: None.   DIET: 2 g salt, low fat, low cholesterol, regular consistency.  No alcohol.   Activity limitations: As tolerated.   FOLLOWUP: [facility] in 2 days after discharge.   CONSULTANTS: Dr. Niel HummerIftikhar.   RADIOLOGIC STUDIES: CT scan of abdomen and pelvis with IV as well as oral contrast August 23, 2012, revealed mild inflammatory changes surrounding the pancreas, correlate for acute pancreatitis. Overall, these changes are decreased from prior. There is a small cystic mass just inferior to the pancreas, which is similar to size on prior. Differential would include a pseudocyst as well as cystic pancreatic  neoplasm. It appears to communicate with the main pancreatic duct, which would raise the possibility of side-branch intraductal papillary mucinous neoplasm. ERCP and endoscopic ultrasound is suggested if not already performed. Mild prominence of sigmoid and the rectal wall  is likely secondary to underdistention, however, correlate for colitis, according to the radiologist. Shoulder left complete x-ray August 24, 2012, showed no acute fracture or dislocation.   HISTORY OF PRESENT ILLNESS: The patient is a 49 year old Caucasian male, with history of alcohol and tobacco abuse, who presented to the hospital with complaints of abdominal pain on August 21, 2012.  Please refer to Dr. Teena IraniElgergawy's admission note on August 21, 2012.  Apparently, the patient started having significant pain and presented to the hospital, where lipase was found to be more than 3000. He was complaining of nausea, but no vomiting. He also had no constipation or diarrhea, or fevers or chills. He received Dilaudid, however, he reported to have ongoing pains, and hospitalist services were contacted for admission.   PHYSICAL EXAMINATION: On arrival to the emergency room, patient's temperature was 98.6, pulse was 86, respiration rate was 18, blood pressure 151/70, saturation was 97% on room air. Physical exam revealed epigastric abdominal pain.   LABORATORY DATA:  The patient's lab data done on the day of admission, August 21, 2012, BMP was unremarkable. The patient's cholesterol  level was checked, was found to be 189. Triglycerides were high at 232. The patient's LDL was elevated at 105. Lipase level, as mentioned above, more than 3000. The patient's alcohol level was 0.01. Liver enzymes were unremarkable. Cardiac enzymes, troponin x1 less than 0.02. White blood cell count was elevated to 12.4, hemoglobin was 13.5, and platelet count 177, with MCV at 102.   The patient's EKG showed normal sinus rhythm at 82 beats per minute, normal axis;  no acute ST-Holland changes were noted.   HOSPITAL COURSE: The patient was admitted to the hospital. He was started on IV fluids, pain medications, antiemetics. He progressively improved. He was also initiated on meropenem. With this combination of medications, he progressively improved, however, his pain was still significant. He underwent a CT scan of his abdomen and pelvis on August 23, 2012, which revealed pancreatic inflammation as well as pancreatic cyst, which was similar in size to the prior. This was discussed with the patient and he related that he had an endoscopic ultrasound apparently at [facility] approximately a year ago. It was felt that the patient is overall improving on the pain medications orally. His condition improved and he was able to be discharged home. Initially, he was not able to tolerate even liquid diet. He was not eating; however, after relief of constipation, his oral intake improved, and by the day of discharge he was able to eat a regular diet. It was felt that his oral intake was improved, however, percentage of meal eaten was not documented. He felt, himself, that he was ready to be discharged. He is, however, to follow up with his primary care physician. While in the hospital, he was followed by Dr. Niel Hummer, gastroenterologist, who did not feel that any other interventions are necessary at this point.   In regards to alcohol abuse, the patient was placed on CIWA scale; however, he did not exhibit any signs of withdrawal.   For tobacco abuse, he was counseled and nicotine replacement therapy was initiated; however, the patient was noncompliant and periodically would go outside to smoke, despite knowing that the hospital has nonsmoking policy.    The patient was noted to have elevated blood pressure. It was felt that the patient's malignant hypertension was very likely pain related. The patient initially did not receive any of his blood pressure medications; however, later his  metoprolol was started and the patient was started also on new medication, lisinopril, with which the patient's blood pressure was better controlled. On day of discharge, the patient's vital signs are stable with temperature 98.2, pulse 78, respiratory rate was 17, blood pressure 137/82. Saturations 93% to 94% on room air at rest.    In regards to COPD, the patient continued receiving as-needed nebulizers; however, he did not have any COPD exacerbation.   For tobacco abuse, as mentioned above, he was counseled and nicotine replacement therapy was initiated.    For microcytic anemia, it was felt to be likely alcohol related. The patient is to follow up with his primary care physician for further recommendations. On day of discharge, the patient's hemoglobin level has somewhat declined; however, that was attributed to rehydration. No active bleeding was noted by the day of discharge. The patient is advised to continue multivitamins. On the day of discharge, August 24, 2012, the patient's hemoglobin is 10.8, which is about the same as it was in August 2013. At that time, the patient's hemoglobin level was 11.3. It was felt that the patient was somewhat hemoconcentrated  on arrival to the hospital; however, the patient's count currently at discharge, hemoglobin very likely represents his euvolemic status.    In regards to lipase levels, initially, as mentioned, the patient's lipase level was very high. It declined steadily and then at discharge, August 24, 2012, was within normal limits at 168. The patient is being discharged in stable condition with the above-mentioned medications and followup.   TIME SPENT: 40 minutes.   Of note, if the patient did not undergo an ERCP or endoscopic ultrasound, it is recommended to have that done as soon as possible.    ____________________________ Katharina Caper, MD rv:th D: 08/24/2012 17:15:00 ET Holland: 08/25/2012 22:22:09 ET JOB#: [id number]  cc: Katharina Caper, MD,  <Dictator> [facility]   Lauro Manlove MD ELECTRONICALLY SIGNED 09/26/2012 16:03

## 2014-11-29 NOTE — Consult Note (Signed)
Brief Consult Note: Diagnosis: 1. Pancreatitis w/out abdominal pain 2. Hypotension 3. HTN 4. Hyperlipidemia 5. GERD 6. Chronic pain.  7. tobacco abuse.   Patient was seen by consultant.   Consult note dictated.   Discussed with Attending MD.   Comments: 49 yo male w/ hx of ETOH indued pancreatitis, HTN, hyperlipidemia, GERD, Chronic pain, tobacco abuse, came into hospital w/ diarrhea, hypotension and also noted to have elevated Lipase consistent w/ pancreatitis.   1. Pancreatitis w/out abdominal pain - has previous hx of it and drank over the weekend at beach but not since past 2 days.  NO abdmoinal pain.  Chemical pancreatitis.  Advised to abstain from ETOH and cont. to encourage fluids.  2. Diarrhea - ?? etiology.  Heme + but Hg. stable.  Has had a colonoscopy as outpatient and if feels worse then can follow up w/ GI as outpatient.  3. Right side pelvic wall mass - unchanged from previous Scan in 07/2011.  Follow up as outpatient.  4. HtN - cont. home meds 5. Hyperlipidemia - cont. home meds 6. GERD - cont. Prevacid.   Pt. does not want to stay in the hospital presently and wants to leave AMA.  Discussed w/ ER physician. Consult Dicated.   Job # [id number].  Electronic Signatures: [place] SirenSainani, Vivek J (MD)  (Signed 09-Apr-13 18:18)  Authored: Brief Consult Note   Last Updated: 09-Apr-13 18:18 by [place] SirenSainani, Vivek J (MD)

## 2014-11-29 NOTE — Discharge Summary (Signed)
PATIENT NAME:  Thomas Holland, Thomas Holland MR#:  [id number] DATE OF BIRTH:  June 18, 1966  DATE OF ADMISSION:  08/04/2011 DATE OF DISCHARGE:  08/07/2011  ADMITTING PHYSICIAN: Dr. Gladstone Lighter  PRIMARY CARE PHYSICIAN: None but he is being discharged to be seen by [facility]  DISCHARGING PHYSICIAN: Dr. Gladstone Lighter   DISCHARGE DIAGNOSES:  1. Acute alcohol-induced pancreatitis.  2. Alcoholic gastritis.  3. 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  4. Tobacco use disorder.  5. Alcohol abuse.  6. Hypertension.  7. Chronic obstructive pulmonary disease.   DISCHARGE MEDICATIONS:  1. Cetirizine 10 mg p.o. daily.  2. Vitamin B12 1000 mcg p.o. daily.  3. Cyclobenzaprine 10 mg p.o. t.i.d. p.r.n.   4. Lovastatin 20 mg p.o. daily.  5. Trazodone 100 mg p.o. at bedtime.  6. Promethazine 12.5 to 25 mg p.o. every six hours p.r.n.  7. Meloxicam 15 mg p.o. once a day.  8. Q-Var 40 mcg per inhalation aerosol 2 puffs twice a day.  9. Proventil inhaler 2 puffs as needed q.6 hours.  10. Tramadol 50 mg q.6 hours p.r.n.  11. DHEA Supplements 25 mg p.o. daily.  12. Metoprolol 25 mg p.o. b.i.d.  13. Norvasc 5 mg p.o. daily.  14. Prilosec 20 mg p.o. twice a day.     DISCHARGE DIET: Advised to eat more soft foot and easy to digest food and advised to avoid alcohol.   FOLLOWUP INSTRUCTIONS: Follow up with [facility] in 1 to 2 weeks.   LABORATORY, DIAGNOSTIC AND RADIOLOGICAL DATA: Labs at the time of discharge: WBC 5.8, hemoglobin 12.2, hematocrit 36.4, platelet count 184.   Sodium 146, potassium 3.9, chloride 112, bicarbonate 27, BUN 3, creatinine 0.76, glucose 88, calcium 7.9. Lipase was normalized to 340 at the time of discharge. Urinalysis negative for any infection. His lipase was more than 3000 on admission LDL cholesterol 107, HDL 23, triglycerides 110, serum cholesterol 152. ESR is elevated at 45. CRP is elevated at 28. Rheumatic arthritis factor normal at 8.3. His ANA  panel was pending at the time of discharge. ASO titer was within normal limits at 55. Chest x-ray showed no mediastinal or hilar lymphadenopathy. No acute disease of the chest.   BRIEF HOSPITAL COURSE: Thomas Holland is a 49 year old Caucasian male with past medical history significant for hypertension, depression, anxiety, alcohol and tobacco use comes to the hospital complaining of acute onset of abdominal pain that started after eating. Patient has had alcoholic-induced pancreatitis in the past but over the last week he has been having severe pain on and off. He was in the Emergency Room one week prior to this admission and his lipase was in 2000s. He was given IV fluids and was discharged to liquid diet. Patient stated that he had eaten a solid diet, a very high fat diet that brought the pain on and came back to the hospital. This time his lipase is greater than 3000. 1. Acute alcohol-induced pancreatitis. He was kept n.p.o., given IV fluids, pain medications. His nausea, vomiting have improved. He still had abdominal pain and the lipase gradually improved and was normalized after which he was started on liquid diet. Today's diet is being advanced to soft diet. He is stable otherwise walking around the hospital. He is advised strongly to stay away from alcohol this time.   2. Hypertension. He was started on metoprolol and Norvasc while in the hospital.  3. Alcoholic gastritis. He does have significant epigastric pain, likely alcohol-induced gastritis. He was on  Protonix IV b.i.d. which relieved his symptoms. He is being discharged on Prilosec 20 mg p.o. b.i.d.  4. Chronic obstructive pulmonary disease. He is on Q-Var steroid inhaler twice a day and also Proventil rescue inhaler as needed.  5. Erythema nodosa. There are 4 to 5 erythematous tender nodules palpated along the skin on the right leg and one nodule on the left leg. Looks more like erythema nodosum. No history of any lupus or other autoimmune  diseases. ANA panel is pending. Chest x-ray did not show any hilar lymphadenopathy, that rules out sarcoidosis, however, angiotensin-converting enzyme level is pending. It could be idiopathic too. He can follow up with primary care physician for the same.  6. Tobacco use disorder. He was strongly counseled against it and he was placed on nicotine patch while in the hospital.  7. His course has been otherwise uneventful while in the hospital.   DISCHARGE CONDITION: Stable.   DISCHARGE DISPOSITION: Home.   TIME SPENT ON DISCHARGE: 45 minutes.   ____________________________ Gladstone Lighter, MD rk:cms D: 08/07/2011 15:14:57 ET T: 08/10/2011 10:41:02 ET JOB#: [id number]  cc: Gladstone Lighter, MD, <Dictator> [place] Gladstone Lighter MD ELECTRONICALLY SIGNED 08/13/2011 13:37

## 2014-11-29 NOTE — H&P (Signed)
PATIENT NAME:  Thomas Holland, Thomas Holland MR#:  [id number] DATE OF BIRTH:  [birth year]  DATE OF ADMISSION:  12/11/2011  PRIMARY CARE PHYSICIAN: Dr. Angus Palms   CHIEF COMPLAINT: Severe abdominal pain.   HISTORY OF PRESENT ILLNESS: Thomas Holland is 49 year old Caucasian male with history of chronic alcoholism, recurrent admissions with bouts of pancreatitis, history of chronic obstructive pulmonary disease and systemic hypertension. The patient came to the Emergency Department yesterday with severe abdominal pain at the epigastric area typical of his acute pancreatitis pain and at same location. The patient reports that he admits that he continues to drink beer and that he brought this to himself. He had blood work-up including lipase done yesterday and that showed a level of 4254. However, the patient decided to go back home because he had to wait longer than he anticipated. He tells me that he went home and he decided to drink what he called boot liquor which is a kind of wine to ease off his pain, however, he woke up at 4:00 in the morning today with severe abdominal pain, sharp in quality, located at the epigastric area. The severity was 9.5 on a scale of 10, associated with nausea but no vomiting. His pain eased off later to 6 on a scale of 10. He had blood work-up repeated today and essentially unchanged from yesterday. His lipase was not repeated. Patient was admitted for treatment of his acute pancreatitis. Last admission to this hospital was in December 2012. At that time he also presented with acute pancreatitis. He had a CAT scan of the abdomen at that time showed inflammation of the pancreas.    REVIEW OF SYSTEMS: CONSTITUTIONAL: Patient denies having any fever. No chills. No fatigue. EYES: No blurring of vision. No double vision. ENT: No hearing impairment. No sore throat. No dysphagia. CARDIOVASCULAR: No chest pain. No shortness of breath. No syncope. RESPIRATORY: No cough. No sputum production. No  chest pain. No shortness of breath. GASTROINTESTINAL: Reporting epigastric pain, nausea but no vomiting. No diarrhea. No melena. No hematochezia. GENITOURINARY: No dysuria. No frequency of urination. MUSCULOSKELETAL: No joint pain or swelling. No muscular pain or swelling. INTEGUMENTARY: No skin rash. No ulcers. NEUROLOGIC: No focal weakness. No seizure activity. No headache. PSYCHIATRY: No anxiety. No depression. ENDOCRINE: No polyuria or polydipsia. No heat or cold intolerance.    PAST MEDICAL HISTORY:  1. Recurrent presentation with acute pancreatitis. 2. Alcoholism. 3. Systemic hypertension. 4. Hypercholesterolemia.  5. Chronic back pain. 6. Chronic obstructive pulmonary disease. 7. Tobacco abuse.   FAMILY HISTORY: His father suffered from coronary artery disease and he passed away. He also had pacemaker. His mother is still healthy and she is at age of 29.   SOCIAL HABITS: Chronic smoker, 1 pack per day since age of 33. He drinks usually beer. He may drink 12 pack a day and some days he does not drink at all. No other drug abuse.   SOCIAL HISTORY: Patient has high school education and he attended some college courses. He is divorced. He works as a Museum/gallery exhibitions officer.   ADMISSION MEDICATIONS:  1. Tramadol 50 mg 3 times a day p.r.n.  2. Metoprolol 50 mg a day or 25 twice a day.  3. Lovastatin 20 mg a day. 4. Claritin 10 mg a day. 5. Clonazepam 1 mg once a day. 6. Flexeril 10 mg 3 times a day.   ALLERGIES: Prilosec causing skin rash and hydrochlorothiazide.   PHYSICAL EXAMINATION:  VITAL SIGNS: Blood pressure 128/83, respiratory rate 22,  pulse 122, temperature 97.6, oxygen saturation 93%.   GENERAL APPEARANCE: Young male lying in bed in no acute distress. He looks somewhat comfortable.   HEAD/NECK: His face is flushed. No pallor. No icterus. No cyanosis.   ENT: Hearing was normal. Nasal mucosa, lips, tongue were normal.   EYES: Normal eyelids and conjunctivae. Pupils about 6 mm,  equal and reactive to light.   NECK: Supple. Trachea at midline. No thyromegaly. No cervical lymphadenopathy. No masses.   HEART: Normal S1, S2. No S3, S4. No murmur. No gallop. No carotid bruits.   RESPIRATORY: Normal breathing pattern without use of accessory muscles. No rales. No wheezing.   ABDOMEN: Soft. He has tenderness at the epigastric area. This is mild to moderate. No rebound. No rigidity. No guarding. No hepatosplenomegaly. No hernias.   SKIN: No ulcers. No subcutaneous nodules.   MUSCULOSKELETAL: No joint swelling. No clubbing.   NEUROLOGIC: Cranial nerves II through XII were intact. No focal motor deficit.   PSYCHIATRIC: Patient is alert, oriented x3. Mood and affect were flat.   LABORATORY, DIAGNOSTIC AND RADIOLOGICAL DATA: Serum glucose 118, BUN 4, creatinine 0.7, sodium 140, potassium 3.6. His liver function tests were normal except for AST went up from 33 up to 41 today. CBC showed white count of 8000, hemoglobin 15, hematocrit 45, platelet count 107. Urinalysis done yesterday was negative.   ASSESSMENT:  1. Acute pancreatitis.  2. Chronic alcoholism.  3. Mild thrombocytopenia.  4. Systemic hypertension.  5. Chronic obstructive pulmonary disease.  6. Tobacco abuse.  7. Hypercholesterolemia.   PLAN: Admit the patient to the medical floor. Keep n.p.o. except for medications. IV hydration with normal saline. Pain control with Demerol, p.r.n. doses of Ativan 1 mg every 4 to 6 hours p.r.n. for agitation and anxiety. Continue metoprolol 25 mg twice a day. Repeat lipase tomorrow. Patient was advised to quit smoking and I placed him on nicotine patch.   TIME SPENT EVALUATING THIS PATIENT: Took more than 50 minutes.   ____________________________ Carney CornersAmir M. Rudene Rearwish, MD amd:cms D: 12/11/2011 07:00:24 ET T: 12/11/2011 09:42:35 ET JOB#: [id number]  cc: Carney CornersAmir M. Rudene Rearwish, MD, <Dictator> Angus PalmsSionne George, MD Karolee OhsAMIR Dala DockM Trinton Prewitt MD ELECTRONICALLY SIGNED 12/11/2011 22:17

## 2014-11-29 NOTE — Discharge Summary (Signed)
PATIENT NAME:  Thomas Holland, Thomas Holland MR#:  [id number] DATE OF BIRTH:  June 13, 1966  DATE OF ADMISSION:  12/11/2011 DATE OF DISCHARGE:  12/12/2011  ADMITTING DIAGNOSIS: Acute pancreatitis.  DISCHARGE DIAGNOSES:  1. Acute pancreatitis, resolved. 2. Alcohol abuse.  3. Hyperglycemia with hemoglobin A1c 5.2. 4. Hypertension, accelerated, likely due to pancreatitis and pain.  5. Hyperlipidemia.  6. Ongoing tobacco abuse.   DISCHARGE CONDITION: Stable.   DISCHARGE MEDICATIONS: The patient is to resume his outpatient medications which are:  1. Vitamin B12 1000 mcg p.o. daily.  2. Cyclobenzaprine 10 mg p.o. three times daily.  3. Lovastatin 20 mg p.o. daily at bedtime. 4. Trazodone 100 p.o. daily at bedtime.  5. Promethazine 25 mg, one-half to one pill every  6 hours as needed.  6. Tramadol 50 mg p.o. three times daily.  7. Metoprolol tartrate 50 mg p.o. daily.  8. Fluticasone 1-2 sprays to each nostril daily.  9. Pantoprazole 30 mg p.o. daily.  10. Clonazepam 1 mg p.o. daily.  11. Loratadine 10 mg p.o. at bedtime.  12. Proventil 2 puffs daily.  13. Qvar 1 puff twice a day.  14. Viagra 50 mg p.o. one hour prior to need.   ADDITIONAL MEDICATIONS:  1. Nicotine patch 21 mg topically daily.  2. Lisinopril 10 mg p.o. daily.   DIET: Two-gram salt, low fat, low cholesterol.   ACTIVITY LIMITATIONS: As tolerated.   FOLLOWUP: Follow-up appointment with Dr. Greggory StallionGeorge two days after discharge.   CONSULTANTS: Care management.   RADIOLOGIC STUDIES: None.  HISTORY OF PRESENT ILLNESS: The patient is a [age] year old Caucasian male with past medical history significant for history of alcohol abuse who presented to the hospital with complaints of severe abdominal pain. Please refer to Dr. Riley Nearingarwish's admission on 12/11/2011. On arrival to the Emergency Room, the patient's vitals showed blood pressure 125/83, respiration rate was 22, pulse 122, temperature is 97.6, oxygen saturation was 93% on room air.   Physical exam was unremarkable except for tenderness in the epigastric area but no other significant changes were noted.   The patient's lab data on 12/11/2011 showed glucose 118.  The  patient's lipase was more than 3000. Hemoglobin A1c was 5.2. Liver enzymes revealed mild elevation of AST to 41 and total protein elevated to 8.3. Otherwise the study was unremarkable. The patient's CBC showed white blood cell count of 8.8, hemoglobin 15.8, platelet count 263. MCV was high at 107. Coagulation panel was unremarkable.    HOSPITAL COURSE:  1. The patient was admitted to the hospital with a diagnosis of acute pancreatitis, alcohol-related likely. He was started on n.p.o., also given IV fluids as well as pain medications. With this therapy, he significantly improved. Initially he was given a clear liquid diet. Later his clear liquid diet was changed to full liquid diet, and finally his diet was advanced to a low fat, low cholesterol diet.  He did not have any significant discomfort after he ate this diet and was ready to be discharged home. On the day of discharge his temperature is 99.1, pulse 86, respiration rate 18, blood pressure 153/85, oxygen saturation was 98% on room air at rest. The patient is to continue low fat, low cholesterol diet and he was counseled numerous times about alcohol abuse. He agreed to join Alcoholics Anonymous.  He is to continue pain medications as well as lansoprazole and clonazepam as needed.  2. Hypoglycemia: The patient's hemoglobin A1c was found to be 5.2. No indications for any other therapies or investigations. 3.  Hypertension: The patient's blood pressure was found to be elevated. It was felt to be due to possibly stress as well as pain.  The patient is to continue metoprolol. The patient was started on lisinopril which was advanced to 10 mg p.o. daily dose. The patient is to continue those medications and follow up with primary care physician, Dr. Greggory Stallion, for further  recommendations.  4. Hyperlipidemia: The patient is to continue his outpatient medications.  5. The patient was counseled about tobacco abuse and nicotine replacement therapy was given upon discharge. He was advised against smoking, especially in view of his worsening lung function.   The patient is being discharged in stable condition with the above-mentioned medications and followup.   TIME SPENT: 40 minutes.     ____________________________ Katharina Caper, MD rv:bjt D: 12/12/2011 20:45:42 ET Holland: 12/13/2011 11:55:33 ET JOB#: [id number]  cc: Katharina Caper, MD, <Dictator> Angus Palms, MD Atticus Lemberger MD ELECTRONICALLY SIGNED 12/16/2011 17:02

## 2014-11-29 NOTE — Consult Note (Signed)
PATIENT NAME:  Thomas Holland, Thomas Holland MR#:  [id number] DATE OF BIRTH:  1965-09-08  DATE OF CONSULTATION:  11/14/2011  REFERRING PHYSICIAN:  Dorothea GlassmanPaul Malinda, MD CONSULTING PHYSICIAN:  Rolly PancakeVivek J. Cherlynn KaiserSainani, MD  PRIMARY CARE PHYSICIAN: [facility]   CHIEF COMPLAINT: Dizziness, diaphoresis, and diarrhea.   HISTORY OF PRESENT ILLNESS: This is a [age] year old male who presents to the emergency room due to diaphoresis and some lightheadedness and also been having diarrhea since yesterday. The patient says that when he went to work today shortly after work he started to feel uneasy and dizzy. He was working outside but in the shade. He went to his truck, drank some Sprite and ate some crackers, and his coworker drove him home. When he got home he became quite diaphoretic and dizzy again. He then started having dry diarrhea. He had 2 to 3 episodes of loose stools. Shortly thereafter he came to the ER for further evaluation. When EMS arrived to take him to the hospital, he was hypotensive. His blood pressure since then has significantly improved. He has had no further diarrhea since this afternoon. He denies any nausea, vomiting, abdominal pain, chest pain, shortness of breath, headache or any other associated symptoms. The patient has a history of alcoholic induced pancreatitis with last bout being in December of this past year. This time his lipase was still noted to be over 3000. Hospitalist services were then contacted for further treatment and evaluation.   REVIEW OF SYSTEMS: CONSTITUTIONAL: No documented fever. No weight gain or weight loss. EYES: No blurred or double vision. ENT: No tinnitus. No postnasal drip. No redness of the oropharynx. RESPIRATORY: No cough, no wheeze, no hemoptysis, and no dyspnea. CARDIOVASCULAR: No chest pain, no orthopnea, no palpitations, and no syncope. GASTROINTESTINAL: Positive nausea. No vomiting. Positive diarrhea. No abdominal pain, no melena, and no hematochezia.  Positive diarrhea. GU: No dysuria or hematuria. ENDOCRINE: No polyuria or nocturia. No heat or cold intolerance. HEME: No anemia, no bruising, and no bleeding. INTEGUMENTARY: No rashes. No lesions. MUSCULOSKELETAL: No arthritis, no swelling, and no gout. NEUROLOGIC: No numbness or tingling. No ataxia. No seizure-type activity. PSYCH: No anxiety, no insomnia, and no ADD.   PAST MEDICAL HISTORY:  1. Hypertension.  2. Hyperlipidemia.  3. History of alcohol-induced pancreatitis.  4. Gastroesophageal reflux disease. 5. Ongoing tobacco abuse. 6. Chronic pain.   ALLERGIES: Hydrochlorothiazide and Prilosec.   SOCIAL HISTORY: He does smoke about a pack per day and has been smoking for the past 30 to 40 years. Occasional alcohol use. He last drank this past weekend, a few mixed drinks along with a few beers. No other recreational drugs.   FAMILY HISTORY: The patient's mother is alive and healthy. Father died from complications of heart disease.   CURRENT MEDICATIONS:  1. Accupril 40 mg daily.  2. Klonopin 1 mg daily.  3. Beclomethasone 0.042 spray two sprays to each nostril twice a day. 4. Flexeril 10 mg three times daily as needed.  5. Flonase two sprays to each nostril daily.  6. Prevacid 30 mg daily.  7. Loratadine 10 mg daily.  8. Lovastatin 20 mg daily.  9. Metoprolol tartrate 50 mg daily.  10. Promethazine 25 mg 1/2 tab to 1 tab every 6 hours as needed.  11. Proventil inhaler 2 puffs in the morning as needed.  12. Q-Var 2 puffs in the morning. 13. Ranitidine 150 mg twice a day. 14. Tramadol 50 mg twice a day.  15. Trazodone 100 mg at bedtime.  16. Vitamin B12 1000 mcg daily. 17. Viagra 50 mg as needed.  18. Triamcinolone 0.1% topical cream to be applied twice a day as needed for rash.   ADMISSION PHYSICAL EXAMINATION:   VITAL SIGNS: Temperature 97.8, pulse 88, respirations 18, blood pressure 133/76, and saturation 100% on room air.   GENERAL: He is a pleasant appearing male in no  apparent distress.   HEENT: Atraumatic, normocephalic. Extraocular muscles are intact. Pupils are equal and reactive to light. Sclerae anicteric. No conjunctival injection. No pharyngeal erythema.   NECK: Supple. No jugular venous distention, bruits, lymphadenopathy, or thyromegaly.   HEART: Regular rate and rhythm. No murmurs, rubs, or clicks.   LUNGS: Clear to auscultation bilaterally. No rales, no rhonchi, and no wheezes.   ABDOMEN: Soft, flat, nontender, and nondistended. Good bowel sounds. No hepatosplenomegaly appreciated.   EXTREMITIES: There is no evidence of any cyanosis, clubbing, or peripheral edema. +2 pedal and radial pulses bilaterally.   NEUROLOGIC: The patient is alert, awake, and oriented x3 with no focal motor or sensory deficits appreciated bilaterally.   SKIN: Moist and warm with no rash appreciated.   LYMPH: No cervical or axillary lymphadenopathy.   LABS/STUDIES: Serum glucose 143, BUN 10, creatinine 1.4, sodium 136, potassium 3.8, chloride 100, bicarbonate 27, and calcium 10.2. Lipase is greater than 3000. LFTs are within normal limits. Troponin less than 0.02. Urine tox positive for TCAD. His white cell count is 17.4, hemoglobin 16.2, hematocrit 48, and platelet count 176.   CT scan of the abdomen and pelvis with contrast is showing no acute abdominal or pelvic pathology. A 3.2 x 1.9 cm right pelvic sidewall mass. This mass is incompletely characterized, although it is similar to the appearance during prior examination, on 07/23/2011.   The patient also had a small lung nodule which is also unchanged from his previous imaging.   ASSESSMENT AND PLAN: This is a 49 year old male with history of alcohol-induced pancreatitis, hypertension, hyperlipidemia, gastroesophageal reflux disease, chronic pain, and tobacco abuse who presents to the hospital with diarrhea and hypotension and also noted to have elevated lipase consistent with pancreatitis.  1. Pancreatitis but  without abdominal pain. He has a previous history of pancreatitis, usually alcohol induced. His alcohol level is normal. He last drank about two days ago. Presently has no abdominal pain. This seems likely a chemical pancreatitis. The patient presently does not want to stay in the hospital. He is strongly advised to abstain from alcohol and encouraged p.o. intake, especially fluids.  2. Diarrhea. The etiology of this is unclear. It has improved. His stools were noted to be heme positive but his hemoglobin is stable. He has had a colonoscopy done as an outpatient greater than five years ago. If his symptoms worsen, he was advised to seek a GI consult as an outpatient.  3. Right-sided pelvic wall mass. This is unchanged from a previous scan in December 2012 and can be further followed up by serial imaging as an outpatient.  4. Hypertension. The patient will resume his home medications, including Accupril and metoprolol.  5. Hyperlipidemia: He will continue with his lovastatin.  6. Gastroesophageal reflux disease. He will continue his Prevacid.   As mentioned, the patient is presently leaving AGAINST MEDICAL ADVICE. I discussed this with the ER physician.  Thank you so much for the consultation.  TIME SPENT: 45 minutes. ____________________________ Rolly Pancake. Cherlynn Kaiser, MD vjs:slb D: 11/14/2011 18:18:07 ET Holland: 11/15/2011 07:44:19 ET JOB#: [id number]  cc: Rolly Pancake. Cherlynn Kaiser, MD, <Dictator> Phineas Real [facility] Siren MD ELECTRONICALLY SIGNED 11/17/2011 14:49

## 2014-11-29 NOTE — Consult Note (Signed)
PATIENT NAME:  Thomas Holland, Thomas Holland MR#:  [id number] DATE OF BIRTH:  1965/11/23  DATE OF CONSULTATION:  02/18/2012  REFERRING PHYSICIAN:  Dr. Duncan Dulleresa Tullo  CONSULTING PHYSICIAN:  Thomas DelShaukat Livan Hires, MD  REASON FOR CONSULTATION: Pancreatitis.   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 Patient has had some alcoholic beverage about 30 hours prior to onset of pain. His lipase was elevated and he was admitted with a diagnosis of acute pancreatitis.   PAST MEDICAL HISTORY:  1. Significant for some sort of soft tissue pericystic mass in suprapubic area which [facility].  2. History of prior pancreatitis. [facility]. 3. History of ongoing tobacco and alcohol use.  4. Hypertension.  5. Generalized anxiety. 6. Chronic obstructive pulmonary disease.   ALLERGIES: None.   REVIEW OF SYSTEMS: Grossly negative except for what is mentioned in the History of Present Illness.   PHYSICAL EXAMINATION:  GENERAL: Well built male, does not appear to be in any acute distress, does appear to have some coarse tremors of his upper extremities.   VITAL SIGNS: Vitals are fairly stable and he is afebrile.   LUNGS: Clear to auscultation bilaterally with fair air entry and no added sounds.   CARDIOVASCULAR: Regular rate and rhythm. No gallops or murmur.   ABDOMEN: Flat abdomen. Bowel sounds are positive but sluggish. Mild epigastric tenderness was noted. There is no rebound or guarding. No hepatosplenomegaly was noted.   EXTREMITIES: No edema.   NEUROLOGIC: Examination appears to be unremarkable.   LABORATORY, DIAGNOSTIC, AND RADIOLOGICAL DATA: White cell count 7.6, hemoglobin 11.6, hematocrit 34.6, platelet count 292. Serum lipase was more than 2000. Liver enzymes are unremarkable as well as electrolytes and BUN  and creatinine. CT scan of abdomen and pelvis showed peripancreatic inflammatory changes. Pancreatic duct is somewhat dilated. A mass-like area of about 2.5 x 2 cm was noted in the head of the pancreas. Prominent lymph nodes were identified within the region of the porta hepatis. The common bile duct appears to be normal, although poorly visualized.   ASSESSMENT AND PLAN: Patient with what appears to be acute pancreatitis secondary to alcohol use. There also appears to be 2.5 x 2 cm area in the head of the pancreas which is concerning for a possible mass versus cystic lesion. Some lymphadenopathy was noted in that area as well. I would treat her conservatively with keeping him n.p.o., use IV fluids and IV pain medicines. Will obtain CA-19-9 and patient will eventually require an endoscopic ultrasound for further evaluation. This has been discussed with the patient. Will follow.   ____________________________ Thomas DelShaukat Aashvi Rezabek, MD si:cms D: 02/18/2012 12:34:43 ET Holland: 02/18/2012 12:49:17 ET JOB#: [id number]  cc: Thomas DelShaukat Toy Samarin, MD, <Dictator> Duncan Dulleresa Tullo, MD Thomas DelSHAUKAT Rafferty Postlewait MD ELECTRONICALLY SIGNED 03/08/2012 13:23

## 2014-11-29 NOTE — H&P (Signed)
PATIENT NAME:  NASSER, KU MR#:  [id number] DATE OF BIRTH:  July 14, 1966  DATE OF ADMISSION:  02/17/2012  CHIEF COMPLAINT: Abdominal pain.   HISTORY OF PRESENT ILLNESS: Mr. 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 after getting up to go to the bathroom. Pain started after he after some Gatorade. He describes the pain as severe starting in the epigastric region and radiating down both sides of the umbilicus to the suprapubic area. It is accompanied by nausea and has not been relieved by IV morphine here in the ED. He states that his last alcoholic beverage was a 24 ounce bottle of beer approximately 30 hours prior to onset of pain. He states that since his last hospitalization he has tried to abstain from alcohol but has not been very successful. He has, however, cut back. He states this is the first time he has drank in 3 to 4 days.   Patient's primary care doctor is Dr. Greggory Stallion at the [facility]. He has seen a gastroenterologist at [facility] but cannot remember his name. He has had one visit there.    PAST MEDICAL HISTORY:  1. 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  2. 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  3. Ongoing tobacco abuse.  4. Hypertension x4 years.  5. Generalized anxiety.  6. Alcohol abuse.  7. Chronic obstructive pulmonary disease.   PAST SURGICAL HISTORY: Notable only for recent biopsy of soft tissue mass in pelvic area.   ALLERGIES: None.   HOSPITALIZATIONS: Last hospitalization May 2013 for acute pancreatitis secondary to alcohol abuse. He has had at least four hospitalizations for similar episodes starting in November 2002.   FAMILY HISTORY: His father was an alcoholic and had coronary artery  disease at age 66 had a pacemaker placed. Mother is healthy at 43.   SOCIAL HISTORY: He is divorced since 27. He smokes a pack and a half a day for at least 30 years, currently smoking at 1 pack a day. He is a current ongoing alcohol abuser, last drink approximately 30 hours ago. He has a prior history of alcohol withdrawal with heavier use but none recently. He is currently an unemployed Industrial/product designer.   REVIEW OF SYSTEMS: Positive for a 15 pound weight loss over the last year despite a good appetite. He denies any changes in vision, glaucoma or cataracts. He denies tinnitus, ear pain, hearing loss or seasonal rhinitis. He has no history of chronic cough but does have some occasional wheezing and dyspnea with exertion. He has a history of chronic obstructive pulmonary disease. He has no history of chest pain, orthopnea, edema or arrhythmia. No history of syncope or palpitations. GI review of systems is positive for nausea and abdominal pain. No changes in bowel habits. No history of rectal bleeding or hemorrhoids. He has no history of hematuria or incontinence. No history of breast mass or breast tenderness. Endocrine review of systems negative for polyuria or nocturia. He does have a history of impotence and uses Viagra p.r.n. He has no history of anemia or easy bruising or bleeding. Integumentary review of systems is negative for changes in moles, hair or skin. Musculoskeletal review of systems is negative for chronic pain other than abdominal pain due to pancreatitis. Neurological review of  systems is negative for numbness, weakness, ataxia, CVAs or seizures. Psychiatric review of systems is positive for generalized anxiety but no history of depression.   PHYSICAL EXAMINATION:  GENERAL: This is a well nourished, well developed male who appears to be in moderate pain.   VITAL SIGNS: Initial blood pressure 146/77, repeat 171/79, pulse initially 102, now 86, temperature 98.4, respirations  18, sating 97% on room air. Pain is currently in 8/10.   HEENT: Pupils are equal, round, reactive to light. Extraocular movements are intact. Sclerae are nonicteric. Oropharynx is benign.   NECK: Supple without lymphadenopathy, JVD, thyromegaly, or carotid bruits.   LUNGS: Clear to auscultation bilaterally with no wheezing or rales.   CARDIOVASCULAR: Regular rate and rhythm. No murmurs, rubs, or gallops. He does have mild gynecomastia.   ABDOMEN: Soft, decreased bowel sounds in all four quadrants and diffusely tender with no rebound but some guarding.   MUSCULOSKELETAL: Grossly nonfocal with good strength in all four extremities.   SKIN: Skin is warm and dry without rashes or lesions.   LYMPH: There is no cervical, axillary, inguinal, or supraclavicular lymphadenopathy.   NEUROLOGICAL: Grossly nonfocal. He has no tremor at current. He is alert and oriented to person, place, and time and cooperative.   LABORATORY, DIAGNOSTIC AND RADIOLOGICAL DATA: Sodium 142, potassium 4.7, chloride 107, bicarbonate 28, BUN 8, creatinine 0.91, glucose 103, white count 12.0, hemoglobin 13.6, platelets 373. LFTs are normal except for albumin of 3.4. Lipase is elevated at 3042. Urinalysis is normal. CT of the abdomen and pelvis with contrast shows a 3.8 mm right lower lobe nodule which apparently has been seen before. He has a pancreatic inflammatory picture around the head of the pancreas and inflammatory changes and a new 2.6 x 2.3 cm mass at the head which is worrisome for a complex versus hemorrhagic pseudocyst with mass effect along the hepatic portion of the celiac artery. Ileus also noted. He has had a stable peri-bladder mass 3.4 x 1.7 cm in size. Portable chest x-ray is within normal limits.   ASSESSMENT AND PLAN:  1. Acute pancreatitis secondary to alcohol abuse. Will admit for IV fluids, pain management and GI consultation given new finding of pancreatic mass suggestive of pseudocyst.  2. 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 Given his 15 pounds weight loss this is worrisome for malignancy. He will need to [facility] if he is not given an endoscopic ultrasound here.  3. Alcohol abuse. Admit with CIWA protocol. He does have a history of withdrawal in the remote past.  4. Tobacco abuse. Nicoderm patch given and counseling given both for alcohol and tobacco abuse.  5. Hypertension. Holding beta blocker, giving IV lisinopril.  6. Chronic obstructive pulmonary disease. Continue inhalers.   I counseled the patient at length regarding his prognosis if he does not abstain from alcohol completely. He appears to understand the gravity of his illness.   ESTIMATED TIME OF CARE: 60 minutes.  ____________________________ Duncan Dulleresa Latria Mccarron, MD tt:cms D: 02/17/2012 16:50:37 ET T: 02/18/2012 07:58:55 ET JOB#: [id number]  cc: Duncan Dulleresa Samael Blades, MD, <Dictator> Angus PalmsSionne George, MD Duncan DullERESA Chana Lindstrom MD ELECTRONICALLY SIGNED 02/18/2012 11:43

## 2016-03-07 DEATH — deceased

## 2017-05-30 ENCOUNTER — Telehealth: Payer: Self-pay | Admitting: *Deleted

## 2017-05-30 NOTE — Telephone Encounter (Signed)
Pt called for a refill of the Hydrocodone.

## 2017-05-31 NOTE — Telephone Encounter (Signed)
After reviewing his chart, he has never been seen here before and the last visit in [facility] was 3 years ago.
